# Patient Record
Sex: Female | Born: 1960 | Race: White | Hispanic: No | Marital: Married | Smoking: Former smoker
Health system: Southern US, Community
[De-identification: ages and names within clinical notes are randomized; demographics above are authoritative.]

## PROBLEM LIST (undated history)

## (undated) DIAGNOSIS — Z8719 Personal history of other diseases of the digestive system: Secondary | ICD-10-CM

## (undated) DIAGNOSIS — K219 Gastro-esophageal reflux disease without esophagitis: Secondary | ICD-10-CM

## (undated) DIAGNOSIS — J45909 Unspecified asthma, uncomplicated: Secondary | ICD-10-CM

## (undated) DIAGNOSIS — R4586 Emotional lability: Secondary | ICD-10-CM

## (undated) DIAGNOSIS — F329 Major depressive disorder, single episode, unspecified: Secondary | ICD-10-CM

## (undated) DIAGNOSIS — J189 Pneumonia, unspecified organism: Secondary | ICD-10-CM

## (undated) DIAGNOSIS — F32A Depression, unspecified: Secondary | ICD-10-CM

## (undated) HISTORY — PX: CHOLECYSTECTOMY: SHX55

## (undated) HISTORY — PX: TENDON RELEASE: SHX230

## (undated) HISTORY — PX: HERNIA REPAIR: SHX51

## (undated) HISTORY — PX: TUBAL LIGATION: SHX77

## (undated) HISTORY — DX: Depression, unspecified: F32.A

## (undated) HISTORY — PX: KNEE ARTHROSCOPY: SUR90

## (undated) HISTORY — PX: EXPLORATORY LAPAROTOMY: SUR591

## (undated) HISTORY — DX: Major depressive disorder, single episode, unspecified: F32.9

## (undated) HISTORY — PX: KNEE SURGERY: SHX244

## (undated) HISTORY — PX: VENTRAL HERNIA REPAIR: SHX424

## (undated) HISTORY — PX: HIATAL HERNIA REPAIR: SHX195

## (undated) HISTORY — PX: BARIATRIC SURGERY: SHX1103

---

## 2004-05-20 ENCOUNTER — Ambulatory Visit: Payer: Self-pay | Admitting: Cardiology

## 2008-05-02 ENCOUNTER — Ambulatory Visit: Payer: Self-pay | Admitting: Obstetrics and Gynecology

## 2008-05-09 ENCOUNTER — Ambulatory Visit: Payer: Self-pay | Admitting: Obstetrics and Gynecology

## 2009-10-02 ENCOUNTER — Ambulatory Visit: Payer: Self-pay | Admitting: Obstetrics and Gynecology

## 2011-07-06 HISTORY — PX: ENDOMETRIAL ABLATION: SHX621

## 2012-06-07 ENCOUNTER — Ambulatory Visit: Payer: Self-pay | Admitting: Family Medicine

## 2012-06-07 LAB — URINALYSIS, COMPLETE
Bacteria: NEGATIVE
Glucose,UR: NEGATIVE mg/dL (ref 0–75)
Ketone: NEGATIVE
Nitrite: NEGATIVE
Protein: NEGATIVE
Specific Gravity: 1.02 (ref 1.003–1.030)

## 2012-06-09 LAB — URINE CULTURE

## 2013-09-18 ENCOUNTER — Ambulatory Visit: Payer: Self-pay | Admitting: Family Medicine

## 2013-09-24 ENCOUNTER — Ambulatory Visit: Payer: Self-pay | Admitting: Unknown Physician Specialty

## 2013-10-18 ENCOUNTER — Ambulatory Visit: Payer: Self-pay | Admitting: Unknown Physician Specialty

## 2013-10-22 LAB — PATHOLOGY REPORT

## 2013-11-03 DIAGNOSIS — K469 Unspecified abdominal hernia without obstruction or gangrene: Secondary | ICD-10-CM | POA: Insufficient documentation

## 2015-04-08 ENCOUNTER — Other Ambulatory Visit: Payer: Self-pay | Admitting: Family Medicine

## 2015-05-30 ENCOUNTER — Other Ambulatory Visit: Payer: Self-pay | Admitting: Family Medicine

## 2015-06-04 ENCOUNTER — Other Ambulatory Visit: Payer: Self-pay | Admitting: Family Medicine

## 2015-06-05 ENCOUNTER — Ambulatory Visit
Admission: EM | Admit: 2015-06-05 | Discharge: 2015-06-05 | Disposition: A | Discharge status: Home / Self Care | Payer: BC Managed Care – PPO | Attending: Emergency Medicine | Admitting: Emergency Medicine

## 2015-06-05 DIAGNOSIS — B349 Viral infection, unspecified: Secondary | ICD-10-CM

## 2015-06-05 DIAGNOSIS — J029 Acute pharyngitis, unspecified: Secondary | ICD-10-CM

## 2015-06-05 HISTORY — DX: Unspecified asthma, uncomplicated: J45.909

## 2015-06-05 LAB — RAPID STREP SCREEN (MED CTR MEBANE ONLY): STREPTOCOCCUS, GROUP A SCREEN (DIRECT): NEGATIVE

## 2015-06-05 MED ORDER — ONDANSETRON 4 MG PO TBDP: 4.0000 mg | ORAL_TABLET | Freq: Three times a day (TID) | ORAL | Status: DC | PRN

## 2015-06-05 NOTE — ED Notes (Signed)
Started last Saturday/Sunday with sore throat. Continues with sore throat but today has headache and abdominal discomfort

## 2015-06-05 NOTE — Discharge Instructions (Signed)
Rest. Drink plenty of fluids. Take over the counter tylenol or ibuprofen as needed. Throat lozenges, warm salt water gargles as needed.   Follow up with your primary care physician this week as needed. Return to Urgent care for new or worsening concerns.   Pharyngitis Pharyngitis is redness, pain, and swelling (inflammation) of your pharynx.  CAUSES  Pharyngitis is usually caused by infection. Most of the time, these infections are from viruses (viral) and are part of a cold. However, sometimes pharyngitis is caused by bacteria (bacterial). Pharyngitis can also be caused by allergies. Viral pharyngitis may be spread from person to person by coughing, sneezing, and personal items or utensils (cups, forks, spoons, toothbrushes). Bacterial pharyngitis may be spread from person to person by more intimate contact, such as kissing.  SIGNS AND SYMPTOMS  Symptoms of pharyngitis include:   Sore throat.   Tiredness (fatigue).   Low-grade fever.   Headache.  Joint pain and muscle aches.  Skin rashes.  Swollen lymph nodes.  Plaque-like film on throat or tonsils (often seen with bacterial pharyngitis). DIAGNOSIS  Your health care provider will ask you questions about your illness and your symptoms. Your medical history, along with a physical exam, is often all that is needed to diagnose pharyngitis. Sometimes, a rapid strep test is done. Other lab tests may also be done, depending on the suspected cause.  TREATMENT  Viral pharyngitis will usually get better in 3-4 days without the use of medicine. Bacterial pharyngitis is treated with medicines that kill germs (antibiotics).  HOME CARE INSTRUCTIONS   Drink enough water and fluids to keep your urine clear or pale yellow.   Only take over-the-counter or prescription medicines as directed by your health care provider:   If you are prescribed antibiotics, make sure you finish them even if you start to feel better.   Do not take aspirin.    Get lots of rest.   Gargle with 8 oz of salt water ( tsp of salt per 1 qt of water) as often as every 1-2 hours to soothe your throat.   Throat lozenges (if you are not at risk for choking) or sprays may be used to soothe your throat. SEEK MEDICAL CARE IF:   You have large, tender lumps in your neck.  You have a rash.  You cough up green, yellow-brown, or bloody spit. SEEK IMMEDIATE MEDICAL CARE IF:   Your neck becomes stiff.  You drool or are unable to swallow liquids.  You vomit or are unable to keep medicines or liquids down.  You have severe pain that does not go away with the use of recommended medicines.  You have trouble breathing (not caused by a stuffy nose). MAKE SURE YOU:   Understand these instructions.  Will watch your condition.  Will get help right away if you are not doing well or get worse.   This information is not intended to replace advice given to you by your health care provider. Make sure you discuss any questions you have with your health care provider.   Document Released: 06/21/2005 Document Revised: 04/11/2013 Document Reviewed: 02/26/2013 Elsevier Interactive Patient Education 2016 [employer]  Viral Infections A viral infection can be caused by different types of viruses.Most viral infections are not serious and resolve on their own. However, some infections may cause severe symptoms and may lead to further complications. SYMPTOMS Viruses can frequently cause:  Minor sore throat.  Aches and pains.  Headaches.  Runny nose.  Different types of  rashes.  Watery eyes.  Tiredness.  Cough.  Loss of appetite.  Gastrointestinal infections, resulting in nausea, vomiting, and diarrhea. These symptoms do not respond to antibiotics because the infection is not caused by bacteria. However, you might catch a bacterial infection following the viral infection. This is sometimes called a "superinfection." Symptoms of such a bacterial  infection may include:  Worsening sore throat with pus and difficulty swallowing.  Swollen neck glands.  Chills and a high or persistent fever.  Severe headache.  Tenderness over the sinuses.  Persistent overall ill feeling (malaise), muscle aches, and tiredness (fatigue).  Persistent cough.  Yellow, green, or brown mucus production with coughing. HOME CARE INSTRUCTIONS   Only take over-the-counter or prescription medicines for pain, discomfort, diarrhea, or fever as directed by your caregiver.  Drink enough water and fluids to keep your urine clear or pale yellow. Sports drinks can provide valuable electrolytes, sugars, and hydration.  Get plenty of rest and maintain proper nutrition. Soups and broths with crackers or rice are fine. SEEK IMMEDIATE MEDICAL CARE IF:   You have severe headaches, shortness of breath, chest pain, neck pain, or an unusual rash.  You have uncontrolled vomiting, diarrhea, or you are unable to keep down fluids.  You or your child has an oral temperature above 102 F (38.9 C), not controlled by medicine.  Your baby is older than 3 months with a rectal temperature of 102 F (38.9 C) or higher.  Your baby is 62 months old or younger with a rectal temperature of 100.4 F (38 C) or higher. MAKE SURE YOU:   Understand these instructions.  Will watch your condition.  Will get help right away if you are not doing well or get worse.   This information is not intended to replace advice given to you by your health care provider. Make sure you discuss any questions you have with your health care provider.   Document Released: 03/31/2005 Document Revised: 09/13/2011 Document Reviewed: 11/27/2014 Elsevier Interactive Patient Education [employer]

## 2015-06-05 NOTE — ED Provider Notes (Signed)
[facility]  ____________________________________________  Time seen: Approximately 1145 am  I have reviewed the triage vital signs and the nursing notes.   HISTORY  Chief Complaint Sore Throat   HPI Annette Perez is a 54 y.o. female presents for the complaints of 2-3 days of sore throat and also reports in the last day or so some intermittent abdominal discomfort which is described as a queasiness occasional nausea. Reports does continue to eat and drink well. Denies vomiting or diarrhea. Denies abdominal pain. She reports sore throat at this time is described as a mild scratchiness at 4 out of 10. Denies pain radiation. States occasionally she will have a headache and she described this as a "stress headache" that is similar to her normal headaches. Denies current headache. States headache as well as sore throat is improved with over-the-counter ibuprofen.  Reports that she does work in [employer] school and frequently exposed to sick children. Denies sick contacts at home. Denies chest pain, shortness breath, abdominal pain, fever, back pain or neck pain. Denies other complaints. Patient states that she is here she primarily wanted to make sure she did not have strep throat.   Past Medical History  Diagnosis Date  . Asthma     There are no active problems to display for this patient.   Past Surgical History  Procedure Laterality Date  . Hiatal hernia repair      1990's  . Cholecystectomy    . Knee arthroscopy    . Bariatric surgery      2011  . Hernia repair      Current Outpatient Rx  Name  Route  Sig  Dispense  Refill  . ADVAIR DISKUS 250-50 MCG/DOSE AEPB      TAKE 1 PUFF TWICE A DAY   60 each   0   . albuterol (PROVENTIL HFA;VENTOLIN HFA) 108 (90 BASE) MCG/ACT inhaler   Inhalation   Inhale into the lungs every 6 (six) hours as needed for wheezing or shortness of breath.         . Multiple Vitamin (MULTIVITAMIN) tablet   Oral   Take 1 tablet by mouth  daily.         . sertraline (ZOLOFT) 100 MG tablet      TAKE 1 TABLET BY MOUTH EVERY DAY   30 tablet   0   .             Allergies Review of patient's allergies indicates no known allergies.  Family History  Problem Relation Age of Onset  . Adopted: Yes    Social History Social History  Substance Use Topics  . Smoking status: Former Games developer  . Smokeless tobacco: None  . Alcohol Use: Yes     Comment: socially    Review of Systems Constitutional: No fever/chills Eyes: No visual changes. WGN:FAOZHYQM sore throat. No nasal congestion, sinus pressure.  Cardiovascular: Denies chest pain. Respiratory: Denies shortness of breath. Gastrointestinal: No abdominal pain.  No nausea, no vomiting.  No diarrhea.  No constipation. Genitourinary: Negative for dysuria. Musculoskeletal: Negative for back pain. Skin: Negative for rash. Neurological: Negative for headaches, focal weakness or numbness.  10-point ROS otherwise negative.  ____________________________________________   PHYSICAL EXAM:  VITAL SIGNS: ED Triage Vitals  Enc Vitals Group     BP 06/05/15 1110 146/82 mmHg     Pulse Rate 06/05/15 1110 54     Resp 06/05/15 1110 16     Temp 06/05/15 1110 96.5 F (35.8 C)  Temp Source 06/05/15 1110 Tympanic     SpO2 06/05/15 1110 98 %     Weight 06/05/15 1110 260 lb (117.935 kg)     Height 06/05/15 1110 5\' 9"  (1.753 m)     Head Cir --      Peak Flow --      Pain Score 06/05/15 1115 6     Pain Loc --      Pain Edu? --      Excl. in GC? --     Constitutional: Alert and oriented. Well appearing and in no acute distress. Eyes: Conjunctivae are normal. PERRL. EOMI. Head: Atraumatic. No sinus tenderness to palpation. No swelling. No erythema.  Ears: no erythema, normal TMs bilaterally.   Nose: No congestion/rhinnorhea.  Mouth/Throat: Mucous membranes are moist.  Minimal pharyngeal erythema. No tonsillar swelling or exudate. No uvular shift or deviation. Neck: No  stridor.  No cervical spine tenderness to palpation. Hematological/Lymphatic/Immunilogical: No cervical lymphadenopathy. Cardiovascular: Normal rate, regular rhythm. Grossly normal heart sounds.  Good peripheral circulation. Respiratory: Normal respiratory effort.  No retractions. Lungs CTAB. No wheezes, rales or rhonchi. Good air movement. Gastrointestinal: Soft and nontender. Obese abdomen. Normal Bowel sounds.  No abdominal bruits. No CVA tenderness. Musculoskeletal: No lower or upper extremity tenderness nor edema.   Bilateral pedal pulses equal and easily palpated.  Neurologic:  Normal speech and language. No gross focal neurologic deficits are appreciated. No gait instability. Skin:  Skin is warm, dry and intact. No rash noted. Psychiatric: Mood and affect are normal. Speech and behavior are normal.  ____________________________________________   LABS (all labs ordered are listed, but only abnormal results are displayed)  Labs Reviewed  RAPID STREP SCREEN (NOT AT [facility])  CULTURE, GROUP A STREP ([facility] ONLY)     INITIAL IMPRESSION / ASSESSMENT AND PLAN / ED COURSE  Pertinent labs & imaging results that were available during my care of the patient were reviewed by me and considered in my medical decision making (see chart for details).  Very well-appearing patient. No acute distress. Presents for complaints of to 3 days of sore throat as well as reported queasiness with nausea. Denies vomiting or diarrhea. Reports continues to eat and drink well. Afebrile. Lungs clear throughout. Abdomen soft and nontender. Quick strep Negative, Will Culture. Discussed with Patient Suspect Viral Illness and Viral pharyngitis.  Discussed Will Treat Supportively will treat with when necessary Zofran and encouraged food and fluids, when necessary over-the-counter Tylenol or ibuprofen. Work note given for today and tomorrow.  Discussed strict follow-up and return parameters. Work note for today and tomorrow  given.scussed follow up with Primary care physician this week. Discussed follow up and return parameters including no resolution or any worsening concerns. Patient verbalized understanding and agreed to plan.   ____________________________________________   FINAL CLINICAL IMPRESSION(S) / ED DIAGNOSES  Final diagnoses:  Pharyngitis  Viral illness       Renford DillsLindsey Haeli Gerlich, NP 06/05/15 1524

## 2015-06-07 LAB — CULTURE, GROUP A STREP (THRC)

## 2015-07-04 ENCOUNTER — Other Ambulatory Visit: Payer: Self-pay | Admitting: Family Medicine

## 2015-08-05 ENCOUNTER — Other Ambulatory Visit: Payer: Self-pay | Admitting: Family Medicine

## 2015-09-07 ENCOUNTER — Other Ambulatory Visit: Payer: Self-pay | Admitting: Family Medicine

## 2015-09-22 ENCOUNTER — Other Ambulatory Visit: Payer: Self-pay | Admitting: Family Medicine

## 2015-10-06 ENCOUNTER — Other Ambulatory Visit: Payer: Self-pay | Admitting: Family Medicine

## 2015-10-07 ENCOUNTER — Ambulatory Visit (INDEPENDENT_AMBULATORY_CARE_PROVIDER_SITE_OTHER): Payer: BC Managed Care – PPO | Admitting: Family Medicine

## 2015-10-07 ENCOUNTER — Encounter: Payer: Self-pay | Admitting: Family Medicine

## 2015-10-07 VITALS — BP 120/80 | HR 72 | Ht 69.0 in | Wt 273.0 lb

## 2015-10-07 DIAGNOSIS — J0100 Acute maxillary sinusitis, unspecified: Secondary | ICD-10-CM

## 2015-10-07 DIAGNOSIS — F329 Major depressive disorder, single episode, unspecified: Secondary | ICD-10-CM | POA: Diagnosis not present

## 2015-10-07 DIAGNOSIS — S0300XA Dislocation of jaw, unspecified side, initial encounter: Secondary | ICD-10-CM

## 2015-10-07 DIAGNOSIS — D692 Other nonthrombocytopenic purpura: Secondary | ICD-10-CM | POA: Diagnosis not present

## 2015-10-07 DIAGNOSIS — F32A Depression, unspecified: Secondary | ICD-10-CM

## 2015-10-07 DIAGNOSIS — J4520 Mild intermittent asthma, uncomplicated: Secondary | ICD-10-CM

## 2015-10-07 MED ORDER — AMOXICILLIN 500 MG PO CAPS: 500.0000 mg | ORAL_CAPSULE | Freq: Three times a day (TID) | ORAL | Status: DC

## 2015-10-07 MED ORDER — ALBUTEROL SULFATE HFA 108 (90 BASE) MCG/ACT IN AERS: 1.0000 | INHALATION_SPRAY | Freq: Four times a day (QID) | RESPIRATORY_TRACT | Status: DC | PRN

## 2015-10-07 MED ORDER — SERTRALINE HCL 100 MG PO TABS: 100.0000 mg | ORAL_TABLET | Freq: Every day | ORAL | Status: DC

## 2015-10-07 MED ORDER — FLUTICASONE-SALMETEROL 250-50 MCG/DOSE IN AEPB: INHALATION_SPRAY | RESPIRATORY_TRACT | Status: DC

## 2015-10-07 NOTE — Progress Notes (Signed)
Name: Annette Perez   MRN: [id number]    DOB: 12/19/1960   Date:10/07/2015       Progress Note  Subjective  Chief Complaint  Chief Complaint  Patient presents with  . Asthma  . Depression  . Ear Pain    L) ear pain- hurts to lay on that side    Asthma She complains of wheezing. There is no chest tightness, cough, difficulty breathing, frequent throat clearing, hemoptysis, hoarse voice, shortness of breath or sputum production. The current episode started more than 1 year ago. The problem occurs intermittently. The problem has been waxing and waning. Associated symptoms include ear pain, nasal congestion, postnasal drip, rhinorrhea and a sore throat. Pertinent negatives include no appetite change, chest pain, dyspnea on exertion, ear congestion, fever, headaches, heartburn, malaise/fatigue, myalgias or weight loss. Her symptoms are aggravated by nothing. Her symptoms are alleviated by beta-agonist and steroid inhaler. Her past medical history is significant for asthma.  Depression        This is a chronic problem.  The current episode started more than 1 year ago.   The onset quality is gradual.   The problem occurs daily.  The problem has been waxing and waning since onset.  Associated symptoms include no decreased concentration, no fatigue, no helplessness, no hopelessness, does not have insomnia, not irritable, no restlessness, no decreased interest, no appetite change, no body aches, no myalgias, no headaches, no indigestion, not sad and no suicidal ideas.  Past treatments include SSRIs - Selective serotonin reuptake inhibitors.  Compliance with treatment is good.   No problem-specific assessment & plan notes found for this encounter.   Past Medical History  Diagnosis Date  . Asthma   . Depression     Past Surgical History  Procedure Laterality Date  . Hiatal hernia repair      1990's  . Cholecystectomy    . Knee arthroscopy    . Bariatric surgery      2011  . Hernia repair       Family History  Problem Relation Age of Onset  . Adopted: Yes    Social History   Social History  . Marital Status: Single    Spouse Name: N/A  . Number of Children: N/A  . Years of Education: N/A   Occupational History  . Not on file.   Social History Main Topics  . Smoking status: Former Games developermoker  . Smokeless tobacco: Not on file  . Alcohol Use: Yes     Comment: socially  . Drug Use: Not on file  . Sexual Activity: Not on file   Other Topics Concern  . Not on file   Social History Narrative    No Known Allergies   Review of Systems  Constitutional: Negative for fever, chills, weight loss, malaise/fatigue, appetite change and fatigue.  HENT: Positive for ear pain, postnasal drip, rhinorrhea and sore throat. Negative for ear discharge and hoarse voice.   Eyes: Negative for blurred vision.  Respiratory: Positive for wheezing. Negative for cough, hemoptysis, sputum production and shortness of breath.   Cardiovascular: Negative for chest pain, dyspnea on exertion, palpitations and leg swelling.  Gastrointestinal: Negative for heartburn, nausea, abdominal pain, diarrhea, constipation, blood in stool and melena.  Genitourinary: Negative for dysuria, urgency, frequency and hematuria.  Musculoskeletal: Negative for myalgias, back pain, joint pain and neck pain.  Skin: Negative for rash.  Neurological: Negative for dizziness, tingling, sensory change, focal weakness and headaches.  Endo/Heme/Allergies: Negative for environmental allergies and  polydipsia. Does not bruise/bleed easily.  Psychiatric/Behavioral: Positive for depression. Negative for suicidal ideas and decreased concentration. The patient is not nervous/anxious and does not have insomnia.      Objective  Filed Vitals:   10/07/15 1337  BP: 120/80  Pulse: 72  Height:  (1.753 m)  Weight: 273 lb (123.832 kg)    Physical Exam  Constitutional: She is well-developed, well-nourished, and in no  distress. She is not irritable. No distress.  HENT:  Head: Normocephalic and atraumatic.  Right Ear: External ear normal.  Left Ear: External ear normal.  Nose: Nose normal.  Mouth/Throat: Oropharynx is clear and moist.  Eyes: Conjunctivae and EOM are normal. Pupils are equal, round, and reactive to light. Right eye exhibits no discharge. Left eye exhibits no discharge.  Neck: Normal range of motion. Neck supple. No JVD present. No thyromegaly present.  Cardiovascular: Normal rate, regular rhythm, normal heart sounds and intact distal pulses.  Exam reveals no gallop and no friction rub.   No murmur heard. Pulmonary/Chest: Effort normal. No respiratory distress. She has no wheezes. She has no rales.  Abdominal: Soft. Bowel sounds are normal. She exhibits no mass. There is no tenderness. There is no guarding.  Musculoskeletal: Normal range of motion. She exhibits tenderness. She exhibits no edema.  Left tmj  Lymphadenopathy:    She has no cervical adenopathy.  Neurological: She is alert. She has normal reflexes.  Skin: Skin is warm and dry. She is not diaphoretic.  Psychiatric: Mood and affect normal.  Nursing note and vitals reviewed.     Assessment & Plan  Problem List Items Addressed This Visit    None    Visit Diagnoses    Asthma, mild intermittent, uncomplicated    -  Primary    Relevant Medications    Fluticasone-Salmeterol (ADVAIR DISKUS) 250-50 MCG/DOSE AEPB    albuterol (PROVENTIL HFA;VENTOLIN HFA) 108 (90 Base) MCG/ACT inhaler    Depression        Relevant Medications    sertraline (ZOLOFT) 100 MG tablet    TMJ (dislocation of temporomandibular joint), initial encounter        suggested aleve    Acute maxillary sinusitis, recurrence not specified        Relevant Medications    amoxicillin (AMOXIL) 500 MG capsule    Purpura (HCC)        Relevant Orders    CBC w/Diff/Platelet         Dr. Hayden [facility]  10/07/2015

## 2015-10-08 LAB — CBC WITH DIFFERENTIAL/PLATELET
BASOS: 0 %
Basophils Absolute: 0 10*3/uL (ref 0.0–0.2)
EOS (ABSOLUTE): 0.1 10*3/uL (ref 0.0–0.4)
EOS: 2 %
HEMATOCRIT: 40.9 % (ref 34.0–46.6)
HEMOGLOBIN: 14.1 g/dL (ref 11.1–15.9)
IMMATURE GRANS (ABS): 0 10*3/uL (ref 0.0–0.1)
IMMATURE GRANULOCYTES: 0 %
Lymphocytes Absolute: 2.1 10*3/uL (ref 0.7–3.1)
Lymphs: 27 %
MCH: 30.1 pg (ref 26.6–33.0)
MCHC: 34.5 g/dL (ref 31.5–35.7)
MCV: 87 fL (ref 79–97)
MONOCYTES: 7 %
MONOS ABS: 0.5 10*3/uL (ref 0.1–0.9)
NEUTROS PCT: 64 %
Neutrophils Absolute: 4.8 10*3/uL (ref 1.4–7.0)
Platelets: 246 10*3/uL (ref 150–379)
RBC: 4.68 x10E6/uL (ref 3.77–5.28)
RDW: 13.1 % (ref 12.3–15.4)
WBC: 7.5 10*3/uL (ref 3.4–10.8)

## 2015-11-05 ENCOUNTER — Other Ambulatory Visit: Payer: Self-pay | Admitting: Family Medicine

## 2015-11-25 ENCOUNTER — Ambulatory Visit (INDEPENDENT_AMBULATORY_CARE_PROVIDER_SITE_OTHER): Payer: BC Managed Care – PPO | Admitting: Family Medicine

## 2015-11-25 ENCOUNTER — Encounter: Payer: Self-pay | Admitting: Family Medicine

## 2015-11-25 VITALS — BP 130/80 | HR 80 | Ht 69.0 in | Wt 269.0 lb

## 2015-11-25 DIAGNOSIS — J0100 Acute maxillary sinusitis, unspecified: Secondary | ICD-10-CM | POA: Diagnosis not present

## 2015-11-25 MED ORDER — AZITHROMYCIN 250 MG PO TABS: ORAL_TABLET | ORAL | Status: DC

## 2015-11-25 NOTE — Progress Notes (Signed)
Name: Annette Perez   MRN: [id number]    DOB: 09/19/1960   Date:11/25/2015       Progress Note  Subjective  Chief Complaint  Chief Complaint  Patient presents with  . Sinusitis    x 1 week- tried Mucinex D- not helping. Has green production and sore throat    Sinusitis This is a new problem. The current episode started in the past 7 days. The problem has been gradually worsening since onset. There has been no fever. Her pain is at a severity of 3/10. Associated symptoms include congestion, coughing, headaches, a hoarse voice, sinus pressure and a sore throat. Pertinent negatives include no chills, diaphoresis, ear pain, neck pain, shortness of breath, sneezing or swollen glands. Past treatments include acetaminophen. The treatment provided no relief.    No problem-specific assessment & plan notes found for this encounter.   Past Medical History  Diagnosis Date  . Asthma   . Depression     Past Surgical History  Procedure Laterality Date  . Hiatal hernia repair      1990's  . Cholecystectomy    . Knee arthroscopy    . Bariatric surgery      2011  . Hernia repair      Family History  Problem Relation Age of Onset  . Adopted: Yes    Social History   Social History  . Marital Status: Single    Spouse Name: N/A  . Number of Children: N/A  . Years of Education: N/A   Occupational History  . Not on file.   Social History Main Topics  . Smoking status: Former Games developermoker  . Smokeless tobacco: Not on file  . Alcohol Use: Yes     Comment: socially  . Drug Use: Not on file  . Sexual Activity: Not on file   Other Topics Concern  . Not on file   Social History Narrative    No Known Allergies   Review of Systems  Constitutional: Negative for fever, chills, weight loss, malaise/fatigue and diaphoresis.  HENT: Positive for congestion, hoarse voice, sinus pressure and sore throat. Negative for ear discharge, ear pain and sneezing.   Eyes: Negative for blurred vision.   Respiratory: Positive for cough. Negative for sputum production, shortness of breath and wheezing.   Cardiovascular: Negative for chest pain, palpitations and leg swelling.  Gastrointestinal: Negative for heartburn, nausea, abdominal pain, diarrhea, constipation, blood in stool and melena.  Genitourinary: Negative for dysuria, urgency, frequency and hematuria.  Musculoskeletal: Negative for myalgias, back pain, joint pain and neck pain.  Skin: Negative for rash.  Neurological: Positive for headaches. Negative for dizziness, tingling, sensory change and focal weakness.  Endo/Heme/Allergies: Negative for environmental allergies and polydipsia. Does not bruise/bleed easily.  Psychiatric/Behavioral: Negative for depression and suicidal ideas. The patient is not nervous/anxious and does not have insomnia.      Objective  Filed Vitals:   11/25/15 1006  BP: 130/80  Pulse: 80  Height: 5\' 9"  (1.753 m)  Weight: 269 lb (122.018 kg)    Physical Exam  Constitutional: She is well-developed, well-nourished, and in no distress. No distress.  HENT:  Head: Normocephalic and atraumatic.  Right Ear: External ear normal.  Left Ear: External ear normal.  Nose: Nose normal.  Mouth/Throat: Oropharynx is clear and moist.  Eyes: Conjunctivae and EOM are normal. Pupils are equal, round, and reactive to light. Right eye exhibits no discharge. Left eye exhibits no discharge.  Neck: Normal range of motion. Neck supple. No  JVD present. No thyromegaly present.  Cardiovascular: Normal rate, regular rhythm, normal heart sounds and intact distal pulses.  Exam reveals no gallop and no friction rub.   No murmur heard. Pulmonary/Chest: Effort normal and breath sounds normal.  Abdominal: Soft. Bowel sounds are normal. She exhibits no mass. There is no tenderness. There is no guarding.  Musculoskeletal: Normal range of motion. She exhibits no edema.  Lymphadenopathy:    She has no cervical adenopathy.   Neurological: She is alert. She has normal reflexes.  Skin: Skin is warm and dry. She is not diaphoretic.  Psychiatric: Mood and affect normal.  Nursing note and vitals reviewed.     Assessment & Plan  Problem List Items Addressed This Visit    None    Visit Diagnoses    Acute maxillary sinusitis, recurrence not specified    -  Primary    Relevant Medications    azithromycin (ZITHROMAX) 250 MG tablet         Dr. [facility]  11/25/2015

## 2015-12-06 ENCOUNTER — Other Ambulatory Visit: Payer: Self-pay | Admitting: Family Medicine

## 2016-02-16 ENCOUNTER — Other Ambulatory Visit: Payer: Self-pay | Admitting: Family Medicine

## 2016-02-17 ENCOUNTER — Other Ambulatory Visit: Payer: Self-pay | Admitting: Family Medicine

## 2016-04-19 ENCOUNTER — Other Ambulatory Visit: Payer: Self-pay | Admitting: Family Medicine

## 2016-04-19 DIAGNOSIS — J4520 Mild intermittent asthma, uncomplicated: Secondary | ICD-10-CM

## 2016-04-22 ENCOUNTER — Ambulatory Visit (INDEPENDENT_AMBULATORY_CARE_PROVIDER_SITE_OTHER): Payer: BC Managed Care – PPO | Admitting: Family Medicine

## 2016-04-22 ENCOUNTER — Encounter: Payer: Self-pay | Admitting: Family Medicine

## 2016-04-22 VITALS — BP 122/64 | HR 59 | Ht 69.0 in | Wt 272.0 lb

## 2016-04-22 DIAGNOSIS — J40 Bronchitis, not specified as acute or chronic: Secondary | ICD-10-CM

## 2016-04-22 DIAGNOSIS — J4521 Mild intermittent asthma with (acute) exacerbation: Secondary | ICD-10-CM

## 2016-04-22 DIAGNOSIS — F5102 Adjustment insomnia: Secondary | ICD-10-CM

## 2016-04-22 MED ORDER — ZOLPIDEM TARTRATE 10 MG PO TABS: 10.0000 mg | ORAL_TABLET | Freq: Every evening | ORAL | 1 refills | Status: DC | PRN

## 2016-04-22 MED ORDER — AMOXICILLIN-POT CLAVULANATE 875-125 MG PO TABS: 1.0000 | ORAL_TABLET | Freq: Two times a day (BID) | ORAL | 0 refills | Status: DC

## 2016-04-22 MED ORDER — GUAIFENESIN-CODEINE 100-10 MG/5ML PO SYRP: 5.0000 mL | ORAL_SOLUTION | Freq: Three times a day (TID) | ORAL | 0 refills | Status: DC | PRN

## 2016-04-22 MED ORDER — ALBUTEROL SULFATE (2.5 MG/3ML) 0.083% IN NEBU: 2.5000 mg | INHALATION_SOLUTION | Freq: Four times a day (QID) | RESPIRATORY_TRACT | 1 refills | Status: DC | PRN

## 2016-04-22 NOTE — Progress Notes (Addendum)
Name: Annette Perez   MRN: [id number]    DOB: 03/12/1961   Date:08/11/2016       Progress Note  Subjective  Chief Complaint  Chief Complaint  Patient presents with  . Cough    dry couch- no production. Doesn't cough much, so she says "used to having bronchitis and this is acting different." Chest is sore, and radiates through back ribs.     Cough  This is a new problem. The current episode started yesterday. The problem has been waxing and waning. The problem occurs every few minutes. The cough is non-productive. Associated symptoms include chills, postnasal drip and shortness of breath. Pertinent negatives include no chest pain, ear congestion, ear pain, fever, headaches, heartburn, hemoptysis, myalgias, nasal congestion, rash, rhinorrhea, sore throat, sweats, weight loss or wheezing. The symptoms are aggravated by cold air. She has tried steroid inhaler and a beta-agonist inhaler for the symptoms. The treatment provided no relief. Her past medical history is significant for asthma and bronchitis. There is no history of bronchiectasis, COPD, emphysema, environmental allergies or pneumonia.  Insomnia  Primary symptoms: fragmented sleep, difficulty falling asleep, no malaise/fatigue.  The current episode started one month. The problem occurs nightly. The symptoms are aggravated by anxiety and family issues. PMH includes: no depression.    No problem-specific Assessment & Plan notes found for this encounter.   Past Medical History:  Diagnosis Date  . Asthma   . Depression     Past Surgical History:  Procedure Laterality Date  . BARIATRIC SURGERY     2011  . CHOLECYSTECTOMY    . HERNIA REPAIR    . HIATAL HERNIA REPAIR     1990's  . KNEE ARTHROSCOPY      Family History  Problem Relation Age of Onset  . Adopted: Yes    Social History   Social History  . Marital status: Single    Spouse name: N/A  . Number of children: N/A  . Years of education: N/A   Occupational History   . Not on file.   Social History Main Topics  . Smoking status: Former Games developermoker  . Smokeless tobacco: Never Used  . Alcohol use Yes     Comment: socially  . Drug use: No  . Sexual activity: Not on file   Other Topics Concern  . Not on file   Social History Narrative  . No narrative on file    No Known Allergies   Review of Systems  Constitutional: Positive for chills. Negative for fever, malaise/fatigue and weight loss.  HENT: Positive for postnasal drip. Negative for ear discharge, ear pain, rhinorrhea and sore throat.   Eyes: Negative for blurred vision.  Respiratory: Positive for cough and shortness of breath. Negative for hemoptysis, sputum production and wheezing.   Cardiovascular: Negative for chest pain, palpitations and leg swelling.  Gastrointestinal: Negative for abdominal pain, blood in stool, constipation, diarrhea, heartburn, melena and nausea.  Genitourinary: Negative for dysuria, frequency, hematuria and urgency.  Musculoskeletal: Negative for back pain, joint pain, myalgias and neck pain.  Skin: Negative for rash.  Neurological: Negative for dizziness, tingling, sensory change, focal weakness and headaches.  Endo/Heme/Allergies: Negative for environmental allergies and polydipsia. Does not bruise/bleed easily.  Psychiatric/Behavioral: Negative for depression and suicidal ideas. The patient is not nervous/anxious and does not have insomnia.      Objective  Vitals:   04/22/16 1456  BP: 122/64  Pulse: (!) 59  Weight: 272 lb (123.4 kg)  Height: 5\' 9"  (1.753  m)    Physical Exam  Constitutional: She is well-developed, well-nourished, and in no distress. No distress.  HENT:  Head: Normocephalic and atraumatic.  Right Ear: External ear normal.  Left Ear: External ear normal.  Nose: Nose normal.  Mouth/Throat: Oropharynx is clear and moist.  Eyes: Conjunctivae and EOM are normal. Pupils are equal, round, and reactive to light. Right eye exhibits no  discharge. Left eye exhibits no discharge.  Neck: Normal range of motion. Neck supple. No JVD present. No thyromegaly present.  Cardiovascular: Normal rate, regular rhythm, normal heart sounds and intact distal pulses.  Exam reveals no gallop and no friction rub.   No murmur heard. Pulmonary/Chest: Effort normal and breath sounds normal. No accessory muscle usage. No respiratory distress.  Increased E/I  Abdominal: Soft. Bowel sounds are normal. She exhibits no mass. There is no tenderness. There is no guarding.  Musculoskeletal: Normal range of motion. She exhibits no edema or tenderness.  Lymphadenopathy:    She has no cervical adenopathy.  Neurological: She is alert. She has normal reflexes.  Skin: Skin is warm and dry. She is not diaphoretic.  Psychiatric: Mood and affect normal.  Nursing note and vitals reviewed.     Assessment & Plan  Problem List Items Addressed This Visit    None    Visit Diagnoses    Mild intermittent reactive airway disease with acute exacerbation    -  Primary   cont bronchodilators   Relevant Medications   albuterol (PROVENTIL) (2.5 MG/3ML) 0.083% nebulizer solution   Adjustment insomnia       Bronchitis            Dr. Hayden [facility]  08/11/16

## 2016-04-23 ENCOUNTER — Other Ambulatory Visit: Payer: Self-pay

## 2016-04-23 ENCOUNTER — Ambulatory Visit: Payer: BC Managed Care – PPO | Admitting: Family Medicine

## 2016-06-14 ENCOUNTER — Encounter: Payer: Self-pay | Admitting: Family Medicine

## 2016-06-14 ENCOUNTER — Ambulatory Visit (INDEPENDENT_AMBULATORY_CARE_PROVIDER_SITE_OTHER): Payer: BC Managed Care – PPO | Admitting: Family Medicine

## 2016-06-14 VITALS — BP 120/70 | HR 70 | Ht 66.0 in | Wt 276.0 lb

## 2016-06-14 DIAGNOSIS — J0100 Acute maxillary sinusitis, unspecified: Secondary | ICD-10-CM

## 2016-06-14 MED ORDER — FLUCONAZOLE 150 MG PO TABS: 150.0000 mg | ORAL_TABLET | Freq: Once | ORAL | 0 refills | Status: AC

## 2016-06-14 MED ORDER — AMOXICILLIN-POT CLAVULANATE 875-125 MG PO TABS: 1.0000 | ORAL_TABLET | Freq: Two times a day (BID) | ORAL | 0 refills | Status: DC

## 2016-06-14 NOTE — Progress Notes (Signed)
Name: Annette Perez   MRN: [id number]    DOB: 07/17/1960   Date:06/14/2016       Progress Note  Subjective  Chief Complaint  Chief Complaint  Patient presents with  . Sinusitis    cong and drainage- facial pressure/ green production- had round of ZPack and Augmentin in October    Sinusitis  This is a new problem. The current episode started 1 to 4 weeks ago. The problem has been waxing and waning since onset. There has been no fever. Her pain is at a severity of 4/10. The pain is moderate. Associated symptoms include congestion, headaches, a hoarse voice, sinus pressure, sneezing and a sore throat. Pertinent negatives include no chills, coughing, diaphoresis, ear pain, neck pain, shortness of breath or swollen glands. Past treatments include nothing. The treatment provided mild relief.    No problem-specific Assessment & Plan notes found for this encounter.   Past Medical History:  Diagnosis Date  . Asthma   . Depression     Past Surgical History:  Procedure Laterality Date  . BARIATRIC SURGERY     2011  . CHOLECYSTECTOMY    . HERNIA REPAIR    . HIATAL HERNIA REPAIR     1990's  . KNEE ARTHROSCOPY      Family History  Problem Relation Age of Onset  . Adopted: Yes    Social History   Social History  . Marital status: Single    Spouse name: N/A  . Number of children: N/A  . Years of education: N/A   Occupational History  . Not on file.   Social History Main Topics  . Smoking status: Former Games developermoker  . Smokeless tobacco: Never Used  . Alcohol use Yes     Comment: socially  . Drug use: No  . Sexual activity: Not on file   Other Topics Concern  . Not on file   Social History Narrative  . No narrative on file    No Known Allergies   Review of Systems  Constitutional: Negative for chills, diaphoresis, fever, malaise/fatigue and weight loss.  HENT: Positive for congestion, hoarse voice, sinus pressure, sneezing and sore throat. Negative for ear discharge  and ear pain.   Eyes: Negative for blurred vision.  Respiratory: Negative for cough, sputum production, shortness of breath and wheezing.   Cardiovascular: Negative for chest pain, palpitations and leg swelling.  Gastrointestinal: Negative for abdominal pain, blood in stool, constipation, diarrhea, heartburn, melena and nausea.  Genitourinary: Negative for dysuria, frequency, hematuria and urgency.  Musculoskeletal: Negative for back pain, joint pain, myalgias and neck pain.  Skin: Negative for rash.  Neurological: Positive for headaches. Negative for dizziness, tingling, sensory change and focal weakness.  Endo/Heme/Allergies: Negative for environmental allergies and polydipsia. Does not bruise/bleed easily.  Psychiatric/Behavioral: Negative for depression and suicidal ideas. The patient is not nervous/anxious and does not have insomnia.      Objective  Vitals:   06/14/16 1029  BP: 120/70  Pulse: 70  Weight: 276 lb (125.2 kg)  Height: 5\' 6"  (1.676 m)    Physical Exam  Constitutional: She is well-developed, well-nourished, and in no distress. No distress.  HENT:  Head: Normocephalic and atraumatic.  Right Ear: External ear normal.  Left Ear: External ear normal.  Nose: Nose normal.  Mouth/Throat: Oropharynx is clear and moist.  Eyes: Conjunctivae and EOM are normal. Pupils are equal, round, and reactive to light. Right eye exhibits no discharge. Left eye exhibits no discharge.  Neck: Normal range  of motion. Neck supple. No JVD present. No thyromegaly present.  Cardiovascular: Normal rate, regular rhythm, normal heart sounds and intact distal pulses.  Exam reveals no gallop and no friction rub.   No murmur heard. Pulmonary/Chest: Effort normal and breath sounds normal.  Abdominal: Soft. Bowel sounds are normal. She exhibits no mass. There is no tenderness. There is no guarding.  Musculoskeletal: Normal range of motion. She exhibits no edema.  Lymphadenopathy:    She has no  cervical adenopathy.  Neurological: She is alert. She has normal reflexes.  Skin: Skin is warm and dry. She is not diaphoretic.  Psychiatric: Mood and affect normal.  Nursing note and vitals reviewed.     Assessment & Plan  Problem List Items Addressed This Visit    None    Visit Diagnoses    Acute maxillary sinusitis, recurrence not specified    -  Primary   Relevant Medications   amoxicillin-clavulanate (AUGMENTIN) 875-125 MG tablet   fluconazole (DIFLUCAN) 150 MG tablet        Dr. Hayden Rasmusseneanna Aras [facility]bar[facility]  06/14/16

## 2016-06-23 ENCOUNTER — Other Ambulatory Visit: Payer: Self-pay | Admitting: Family Medicine

## 2016-07-29 ENCOUNTER — Other Ambulatory Visit: Payer: Self-pay | Admitting: Family Medicine

## 2016-07-29 DIAGNOSIS — J4520 Mild intermittent asthma, uncomplicated: Secondary | ICD-10-CM

## 2016-08-11 ENCOUNTER — Other Ambulatory Visit: Payer: Self-pay

## 2016-08-18 ENCOUNTER — Encounter: Payer: Self-pay | Admitting: Family Medicine

## 2016-08-18 ENCOUNTER — Ambulatory Visit (INDEPENDENT_AMBULATORY_CARE_PROVIDER_SITE_OTHER): Payer: BC Managed Care – PPO | Admitting: Family Medicine

## 2016-08-18 VITALS — BP 110/70 | HR 76 | Temp 98.0°F | Ht 66.0 in | Wt 274.0 lb

## 2016-08-18 DIAGNOSIS — J111 Influenza due to unidentified influenza virus with other respiratory manifestations: Secondary | ICD-10-CM

## 2016-08-18 DIAGNOSIS — J4521 Mild intermittent asthma with (acute) exacerbation: Secondary | ICD-10-CM

## 2016-08-18 DIAGNOSIS — R69 Illness, unspecified: Secondary | ICD-10-CM

## 2016-08-18 MED ORDER — OSELTAMIVIR PHOSPHATE 75 MG PO CAPS: 75.0000 mg | ORAL_CAPSULE | Freq: Two times a day (BID) | ORAL | 0 refills | Status: DC

## 2016-08-18 MED ORDER — GUAIFENESIN-CODEINE 100-10 MG/5ML PO SYRP: 5.0000 mL | ORAL_SOLUTION | Freq: Three times a day (TID) | ORAL | 0 refills | Status: DC | PRN

## 2016-08-18 NOTE — Progress Notes (Signed)
Name: Annette Perez   MRN: [id number]    DOB: 1961-03-25   Date:08/18/2016       Progress Note  Subjective  Chief Complaint  Chief Complaint  Patient presents with  . Sinusitis    headache x 2 days- will not go away, lethargic, but sleeping good at night. 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    Fever   This is a new problem. The current episode started in the past 7 days. The problem occurs intermittently. The problem has been waxing and waning. The maximum temperature noted was 99 to 99.9 F. Associated symptoms include abdominal pain, congestion, coughing, headaches and wheezing. Pertinent negatives include no chest pain, diarrhea, ear pain, muscle aches, nausea, rash, sleepiness, sore throat, urinary pain or vomiting. Associated symptoms comments: Chills/. She has tried NSAIDs for the symptoms. The treatment provided no relief.  Risk factors: sick contacts     No problem-specific Assessment & Plan notes found for this encounter.   Past Medical History:  Diagnosis Date  . Asthma   . Depression     Past Surgical History:  Procedure Laterality Date  . BARIATRIC SURGERY     2011  . CHOLECYSTECTOMY    . HERNIA REPAIR    . HIATAL HERNIA REPAIR     1990's  . KNEE ARTHROSCOPY      Family History  Problem Relation Age of Onset  . Adopted: Yes    Social History   Social History  . Marital status: Single    Spouse name: N/A  . Number of children: N/A  . Years of education: N/A   Occupational History  . Not on file.   Social History Main Topics  . Smoking status: Former Games developer  . Smokeless tobacco: Never Used  . Alcohol use Yes     Comment: socially  . Drug use: No  . Sexual activity: Not on file   Other Topics Concern  . Not on file   Social History Narrative  . No narrative on file    Allergies  Allergen Reactions  . Ibuprofen Other (See Comments)    weightloss surgery     Review of Systems  Constitutional: Positive for fever. Negative for  chills, malaise/fatigue and weight loss.  HENT: Positive for congestion. Negative for ear discharge, ear pain and sore throat.   Eyes: Negative for blurred vision.  Respiratory: Positive for cough and wheezing. Negative for sputum production and shortness of breath.   Cardiovascular: Negative for chest pain, palpitations and leg swelling.  Gastrointestinal: Positive for abdominal pain. Negative for blood in stool, constipation, diarrhea, heartburn, melena, nausea and vomiting.  Genitourinary: Negative for dysuria, frequency, hematuria and urgency.  Musculoskeletal: Positive for myalgias. Negative for back pain, joint pain and neck pain.  Skin: Negative for rash.  Neurological: Positive for headaches. Negative for dizziness, tingling, sensory change and focal weakness.  Endo/Heme/Allergies: Negative for environmental allergies and polydipsia. Does not bruise/bleed easily.  Psychiatric/Behavioral: Negative for depression and suicidal ideas. The patient is not nervous/anxious and does not have insomnia.      Objective  Vitals:   08/18/16 0923  BP: 110/70  Pulse: 76  Temp: 98 F (36.7 C)  TempSrc: Oral  Weight: 274 lb (124.3 kg)  Height: 5\' 6"  (1.676 m)    Physical Exam  Constitutional: She is well-developed, well-nourished, and in no distress. No distress.  HENT:  Head: Normocephalic and atraumatic.  Right Ear: External ear normal.  Left Ear: External ear normal.  Nose: Nose  normal.  Mouth/Throat: Oropharynx is clear and moist.  Eyes: Conjunctivae and EOM are normal. Pupils are equal, round, and reactive to light. Right eye exhibits no discharge. Left eye exhibits no discharge.  Neck: Normal range of motion. Neck supple. No JVD present. No thyromegaly present.  Cardiovascular: Normal rate, regular rhythm, normal heart sounds and intact distal pulses.  Exam reveals no gallop and no friction rub.   No murmur heard. Pulmonary/Chest: Effort normal. She has wheezes. She has no rales.   E/I increased  Abdominal: Soft. Bowel sounds are normal. She exhibits no mass. There is no tenderness. There is no guarding.  Musculoskeletal: Normal range of motion. She exhibits no edema.  Lymphadenopathy:    She has no cervical adenopathy.  Neurological: She is alert. She has normal reflexes.  Skin: Skin is warm and dry. She is not diaphoretic.  Psychiatric: Mood and affect normal.  Nursing note and vitals reviewed.     Assessment & Plan  Problem List Items Addressed This Visit    None    Visit Diagnoses    Influenza-like illness    -  Primary   Relevant Medications   oseltamivir (TAMIFLU) 75 MG capsule   guaiFENesin-codeine (ROBITUSSIN AC) 100-10 MG/5ML syrup   Mild intermittent asthma with acute exacerbation       continue albuterol and advair        Dr. Hayden Rasmusseneanna Essie Lagunes [facility]  08/18/16

## 2016-08-18 NOTE — Patient Instructions (Signed)

## 2016-09-05 ENCOUNTER — Other Ambulatory Visit: Payer: Self-pay | Admitting: Family Medicine

## 2016-09-05 DIAGNOSIS — J4520 Mild intermittent asthma, uncomplicated: Secondary | ICD-10-CM

## 2016-10-07 ENCOUNTER — Other Ambulatory Visit: Payer: Self-pay | Admitting: Family Medicine

## 2016-10-07 DIAGNOSIS — J4520 Mild intermittent asthma, uncomplicated: Secondary | ICD-10-CM

## 2016-10-13 ENCOUNTER — Other Ambulatory Visit: Payer: Self-pay | Admitting: Family Medicine

## 2016-10-13 DIAGNOSIS — J4520 Mild intermittent asthma, uncomplicated: Secondary | ICD-10-CM

## 2016-10-26 ENCOUNTER — Other Ambulatory Visit: Payer: Self-pay | Admitting: Family Medicine

## 2016-11-05 ENCOUNTER — Other Ambulatory Visit: Payer: Self-pay

## 2016-11-22 ENCOUNTER — Encounter: Payer: Self-pay | Admitting: *Deleted

## 2016-11-22 ENCOUNTER — Ambulatory Visit
Admission: EM | Admit: 2016-11-22 | Discharge: 2016-11-22 | Disposition: A | Discharge status: Home / Self Care | Payer: BC Managed Care – PPO | Attending: Family Medicine | Admitting: Family Medicine

## 2016-11-22 ENCOUNTER — Ambulatory Visit: Payer: BC Managed Care – PPO | Admitting: Family Medicine

## 2016-11-22 DIAGNOSIS — K047 Periapical abscess without sinus: Secondary | ICD-10-CM

## 2016-11-22 DIAGNOSIS — K0889 Other specified disorders of teeth and supporting structures: Secondary | ICD-10-CM | POA: Diagnosis not present

## 2016-11-22 MED ORDER — PENICILLIN V POTASSIUM 500 MG PO TABS: 500.0000 mg | ORAL_TABLET | Freq: Four times a day (QID) | ORAL | 0 refills | Status: AC

## 2016-11-22 MED ORDER — OXYCODONE-ACETAMINOPHEN 5-325 MG PO TABS: 1.0000 | ORAL_TABLET | Freq: Three times a day (TID) | ORAL | 0 refills | Status: DC | PRN

## 2016-11-22 NOTE — ED Triage Notes (Signed)
Patient started having lower left jaw dental pain this AM. Left lower molar is visibly decayed.

## 2016-11-22 NOTE — ED Provider Notes (Signed)
[facility] ____________________________________________  Time seen: Approximately 1:06 PM  I have reviewed the triage vital signs and the nursing notes.   HISTORY  Chief Complaint Oral Swelling    HPI Annette Perez is a 56 y.o. female  present for evaluation of left lower dental pain has been present for the last few days with associated left facial swelling. Patient states swelling occurred in the last 24 hours. Reports continues to eat and drink well. Denies any fevers. Denies other known triggers. Patient reports she has had left lower molar has had a previous large filling that came out several years ago but denies any issues. Patient reports tenderness directly around same tooth. Denies cough, congestion, sore throat, vision changes. Reports over-the-counter ibuprofen has helped some, no resolution. Denies any recent antibiotic use. States pain is moderate at this time. Denies chest pain or shortness of breath, abdominal pain. Reports otherwise feels well.  No LMP recorded. Patient has had an ablation.   Past Medical History:  Diagnosis Date  . Asthma   . Depression     There are no active problems to display for this patient.   Past Surgical History:  Procedure Laterality Date  . BARIATRIC SURGERY     2011  . CHOLECYSTECTOMY    . HERNIA REPAIR    . HIATAL HERNIA REPAIR     1990's  . KNEE ARTHROSCOPY      Current Outpatient Rx  . Order #: [id number] Class: Normal  . Order #: [id number] Class: Historical Med  . Order #: [id number] Class: Normal  . Order #: [id number] Class: Normal  . Order #: [id number] Class: Normal  . Order #: [id number] Class: Print  . Order #: [id number] Class: Normal  . Order #: [id number] Class: Print  . Order #: [id number] Class: Normal    Allergies Ibuprofen  Family History  Problem Relation Age of Onset  . Adopted: Yes    Social History Social History  Substance Use Topics  . Smoking status: Former Games developermoker  . Smokeless  tobacco: Never Used  . Alcohol use Yes     Comment: socially    Review of Systems Constitutional: No fever/chills. Reports continues to eat and drink foods and fluids well.  Eyes: No visual changes. ENT: No sore throat. Cardiovascular: Denies chest pain. Respiratory: Denies shortness of breath. Gastrointestinal: No abdominal pain.  No nausea, no vomiting.  Genitourinary: Negative for dysuria. Musculoskeletal: Negative for back pain. Skin: Negative for rash.  ____________________________________________   PHYSICAL EXAM:  VITAL SIGNS: ED Triage Vitals  Enc Vitals Group     BP 11/22/16 1141 (!) 155/77     Pulse Rate 11/22/16 1141 (!) 56     Resp 11/22/16 1141 16     Temp 11/22/16 1141 97.8 F (36.6 C)     Temp Source 11/22/16 1141 Oral     SpO2 11/22/16 1141 98 %     Weight 11/22/16 1143 270 lb (122.5 kg)     Height 11/22/16 1143 5\' 9"  (1.753 m)     Head Circumference --      Peak Flow --      Pain Score 11/22/16 1143 8     Pain Loc --      Pain Edu? --      Excl. in GC? --     Constitutional: Alert and oriented. Well appearing and in no acute distress. Eyes: Conjunctivae are normal. PERRL. EOMI. Head: Atraumatic.No clear facial swelling. No erythema or induration. Full range of motion with opening and closing mouth. Ears: Bilateral ears  no erythema, normal TMs.  Nose: No congestion/rhinnorhea. Mouth/Throat: Mucous membranes are moist.  Oropharynx non-erythematous.No tonsillar swelling or exudate. Periodontal Exam   Moderate tenderness to palpation directly along gumline tooth #17 with tooth #17 with fracture and dental decay, no visualized or palpated abscess, no other dental tenderness noted. Widespread dental decay. Neck: No stridor.  Hematological/Lymphatic/Immunilogical: No cervical lymphadenopathy. Cardiovascular:   Normal rate, regular rhythm. Grossly normal heart sounds. Good peripheral circulation. Respiratory: Normal respiratory effort.  No  retractions. Musculoskeletal: No midline cervical, thoracic or lumbar tenderness to palpation.  Neurologic:  Normal speech and language. No gross focal neurologic deficits are appreciated. Speech is normal. No gait instability. Skin:  Skin is warm, dry. Psychiatric: Mood and affect are normal. Speech and behavior are normal.  ____________________________________________   LABS (all labs ordered are listed, but only abnormal results are displayed)  Labs Reviewed - No data to display ____________________________________________   INITIAL IMPRESSION / ASSESSMENT AND PLAN / ED COURSE  Pertinent labs & imaging results that were available during my care of the patient were reviewed by me and considered in my medical decision making (see chart for details).  Well-appearing patient. No acute distress. Suspect dental infection second to dental decay. Will treat patient with oral Pen-Vee K. No visualized or palpated abscess at this time. Encouraged continued home ibuprofen as needed. Wants to none Percocet given as needed for breakthrough pain. [place] controlled substance database reviewed for last one year, and  10/17/16 last documented of ambien. Discussed indication, risks and benefits of medications with patient.  Patient was advised to see the dentist  as soon as possible. Also advised to take the antibiotic until finished. Instructed to return to the Urgent Care or ER  for symptoms that change or worsen or if unable to schedule an appointment. Patient verbalized understanding and agreed with this plan. ____________________________________________   FINAL CLINICAL IMPRESSION(S) / ED DIAGNOSES  Final diagnoses:  Pain, dental  Dental infection         Renford Dills, NP 11/22/16 1417

## 2016-11-22 NOTE — Discharge Instructions (Signed)
Take medication as prescribed. Rest. Drink plenty of fluids.  ° °Follow up with Dentist as soon as possible.  ° °Follow up with your primary care physician this week as needed. Return to Urgent care for new or worsening concerns.  ° ° °

## 2016-12-26 ENCOUNTER — Other Ambulatory Visit: Payer: Self-pay | Admitting: Family Medicine

## 2016-12-26 DIAGNOSIS — J4520 Mild intermittent asthma, uncomplicated: Secondary | ICD-10-CM

## 2017-02-20 ENCOUNTER — Other Ambulatory Visit: Payer: Self-pay | Admitting: Family Medicine

## 2017-02-20 DIAGNOSIS — J4520 Mild intermittent asthma, uncomplicated: Secondary | ICD-10-CM

## 2017-03-24 ENCOUNTER — Other Ambulatory Visit: Payer: Self-pay | Admitting: Family Medicine

## 2017-03-24 DIAGNOSIS — J4520 Mild intermittent asthma, uncomplicated: Secondary | ICD-10-CM

## 2017-03-25 ENCOUNTER — Other Ambulatory Visit: Payer: Self-pay | Admitting: Family Medicine

## 2017-05-02 ENCOUNTER — Other Ambulatory Visit: Payer: Self-pay | Admitting: Family Medicine

## 2017-05-06 ENCOUNTER — Other Ambulatory Visit: Payer: Self-pay | Admitting: Family Medicine

## 2017-05-06 DIAGNOSIS — J4520 Mild intermittent asthma, uncomplicated: Secondary | ICD-10-CM

## 2017-06-04 ENCOUNTER — Other Ambulatory Visit: Payer: Self-pay | Admitting: Family Medicine

## 2017-07-16 ENCOUNTER — Other Ambulatory Visit: Payer: Self-pay | Admitting: Family Medicine

## 2017-07-20 ENCOUNTER — Other Ambulatory Visit: Payer: Self-pay | Admitting: Family Medicine

## 2017-07-25 ENCOUNTER — Ambulatory Visit: Payer: BC Managed Care – PPO | Admitting: Family Medicine

## 2017-07-25 ENCOUNTER — Encounter: Payer: Self-pay | Admitting: Family Medicine

## 2017-07-25 VITALS — BP 120/78 | HR 60 | Ht 69.0 in | Wt 274.0 lb

## 2017-07-25 DIAGNOSIS — Z1231 Encounter for screening mammogram for malignant neoplasm of breast: Secondary | ICD-10-CM

## 2017-07-25 DIAGNOSIS — J4521 Mild intermittent asthma with (acute) exacerbation: Secondary | ICD-10-CM | POA: Diagnosis not present

## 2017-07-25 DIAGNOSIS — J40 Bronchitis, not specified as acute or chronic: Secondary | ICD-10-CM

## 2017-07-25 DIAGNOSIS — Z1211 Encounter for screening for malignant neoplasm of colon: Secondary | ICD-10-CM

## 2017-07-25 DIAGNOSIS — Z23 Encounter for immunization: Secondary | ICD-10-CM | POA: Diagnosis not present

## 2017-07-25 DIAGNOSIS — F329 Major depressive disorder, single episode, unspecified: Secondary | ICD-10-CM

## 2017-07-25 DIAGNOSIS — Z1239 Encounter for other screening for malignant neoplasm of breast: Secondary | ICD-10-CM

## 2017-07-25 MED ORDER — SERTRALINE HCL 100 MG PO TABS: 100.0000 mg | ORAL_TABLET | Freq: Every day | ORAL | 1 refills | Status: DC

## 2017-07-25 MED ORDER — AZITHROMYCIN 250 MG PO TABS: ORAL_TABLET | ORAL | 0 refills | Status: DC

## 2017-07-25 MED ORDER — FLUTICASONE-SALMETEROL 250-50 MCG/DOSE IN AEPB: INHALATION_SPRAY | RESPIRATORY_TRACT | 11 refills | Status: DC

## 2017-07-25 MED ORDER — ALBUTEROL SULFATE (2.5 MG/3ML) 0.083% IN NEBU: 2.5000 mg | INHALATION_SOLUTION | Freq: Four times a day (QID) | RESPIRATORY_TRACT | 1 refills | Status: DC | PRN

## 2017-07-25 MED ORDER — GUAIFENESIN-CODEINE 100-10 MG/5ML PO SYRP: 5.0000 mL | ORAL_SOLUTION | Freq: Three times a day (TID) | ORAL | 0 refills | Status: DC | PRN

## 2017-07-25 MED ORDER — ALBUTEROL SULFATE HFA 108 (90 BASE) MCG/ACT IN AERS: INHALATION_SPRAY | RESPIRATORY_TRACT | 6 refills | Status: DC

## 2017-07-25 NOTE — Progress Notes (Signed)
Name: Annette Perez   MRN: [id number]    DOB: 1960-10-25   Date:07/25/2017       Progress Note  Subjective  Chief Complaint  Chief Complaint  Patient presents with  . Asthma  . Depression  . Sinusitis    started yesterday with sinus pressure and chest cong/ cough.    Asthma  She complains of cough, sputum production and wheezing. There is no chest tightness, difficulty breathing, frequent throat clearing, hemoptysis, hoarse voice or shortness of breath. This is a recurrent problem. The problem occurs intermittently. The problem has been waxing and waning. The cough is non-productive. Associated symptoms include nasal congestion, postnasal drip and rhinorrhea. Pertinent negatives include no appetite change, chest pain, dyspnea on exertion, ear pain, fever, headaches, heartburn, malaise/fatigue, myalgias, orthopnea, PND, sneezing, sore throat or weight loss. Her symptoms are aggravated by change in weather. Her symptoms are alleviated by beta-agonist and steroid inhaler (laba). She reports moderate improvement on treatment. Her past medical history is significant for asthma. There is no history of bronchiectasis, bronchitis, COPD, emphysema or pneumonia.  Depression       The patient presents with depression.  This is a recurrent problem.  The current episode started more than 1 year ago.   The onset quality is sudden.   The problem occurs intermittently.  Associated symptoms include no decreased concentration, no fatigue, no helplessness, no hopelessness, does not have insomnia, not irritable, no restlessness, no decreased interest, no appetite change, no body aches, no myalgias, no headaches, no indigestion, not sad and no suicidal ideas.     The symptoms are aggravated by nothing.  Past treatments include SSRIs - Selective serotonin reuptake inhibitors.  Compliance with treatment is good.  Previous treatment provided moderate relief.  Past medical history includes depression.     Pertinent negatives  include no chronic fatigue syndrome and no chronic pain. Sinusitis  This is a new problem. The current episode started in the past 7 days. The problem is unchanged. There has been no fever. The pain is moderate. Associated symptoms include coughing. Pertinent negatives include no chills, congestion, diaphoresis, ear pain, headaches, hoarse voice, neck pain, shortness of breath, sinus pressure, sneezing, sore throat or swollen glands. Past treatments include acetaminophen. The treatment provided moderate relief.    No problem-specific Assessment & Plan notes found for this encounter.   Past Medical History:  Diagnosis Date  . Asthma   . Depression     Past Surgical History:  Procedure Laterality Date  . BARIATRIC SURGERY     2011  . CHOLECYSTECTOMY    . HERNIA REPAIR    . HIATAL HERNIA REPAIR     1990's  . KNEE ARTHROSCOPY      Family History  Adopted: Yes    Social History   Socioeconomic History  . Marital status: Single    Spouse name: Not on file  . Number of children: Not on file  . Years of education: Not on file  . Highest education level: Not on file  Social Needs  . Financial resource strain: Not on file  . Food insecurity - worry: Not on file  . Food insecurity - inability: Not on file  . Transportation needs - medical: Not on file  . Transportation needs - non-medical: Not on file  Occupational History  . Not on file  Tobacco Use  . Smoking status: Former Games developer  . Smokeless tobacco: Never Used  Substance and Sexual Activity  . Alcohol use: Yes  Comment: socially  . Drug use: No  . Sexual activity: Not on file  Other Topics Concern  . Not on file  Social History Narrative  . Not on file    Allergies  Allergen Reactions  . Ibuprofen Other (See Comments)    weightloss surgery    Outpatient Medications Prior to Visit  Medication Sig Dispense Refill  . Multiple Vitamin (MULTIVITAMIN) tablet Take 1 tablet by mouth daily.    Marland Kitchen. ADVAIR DISKUS  250-50 MCG/DOSE AEPB INHALE 1 PUFF INTO THE LUNGS TWICE A DAY 60 each 0  . albuterol (PROAIR HFA) 108 (90 Base) MCG/ACT inhaler Inhale 1-2 puffs every 6 hours as needed for SOB 1 Inhaler 1  . albuterol (PROVENTIL) (2.5 MG/3ML) 0.083% nebulizer solution Take 3 mLs (2.5 mg total) by nebulization every 6 (six) hours as needed for wheezing or shortness of breath. 150 mL 1  . PROAIR HFA 108 (90 Base) MCG/ACT inhaler INHALE 1-2 PUFFS INTO THE LUNGS EVERY 6 HOURS AS NEEDED FOR WHEEZING OR SHORTNESS OF BREATH. 18 g 1  . sertraline (ZOLOFT) 100 MG tablet TAKE 1 TABLET BY MOUTH EVERY DAY 15 tablet 0  . guaiFENesin-codeine (ROBITUSSIN AC) 100-10 MG/5ML syrup Take 5 mLs by mouth 3 (three) times daily as needed for cough. 150 mL 0  . oseltamivir (TAMIFLU) 75 MG capsule Take 1 capsule (75 mg total) by mouth 2 (two) times daily. 10 capsule 0  . oxyCODONE-acetaminophen (ROXICET) 5-325 MG tablet Take 1 tablet by mouth every 8 (eight) hours as needed for moderate pain or severe pain (Do not drive or operate heavy machinery while taking as can cause drowsiness.). 9 tablet 0   No facility-administered medications prior to visit.     Review of Systems  Constitutional: Negative for appetite change, chills, diaphoresis, fatigue, fever, malaise/fatigue and weight loss.  HENT: Positive for postnasal drip and rhinorrhea. Negative for congestion, ear discharge, ear pain, hoarse voice, sinus pressure, sneezing and sore throat.   Eyes: Negative for blurred vision.  Respiratory: Positive for cough, sputum production and wheezing. Negative for hemoptysis and shortness of breath.   Cardiovascular: Negative for chest pain, dyspnea on exertion, palpitations, leg swelling and PND.  Gastrointestinal: Negative for abdominal pain, blood in stool, constipation, diarrhea, heartburn, melena and nausea.  Genitourinary: Negative for dysuria, frequency, hematuria and urgency.  Musculoskeletal: Negative for back pain, joint pain, myalgias  and neck pain.  Skin: Negative for rash.  Neurological: Negative for dizziness, tingling, sensory change, focal weakness and headaches.  Endo/Heme/Allergies: Negative for environmental allergies and polydipsia. Does not bruise/bleed easily.  Psychiatric/Behavioral: Positive for depression. Negative for decreased concentration and suicidal ideas. The patient is not nervous/anxious and does not have insomnia.      Objective  Vitals:   07/25/17 0939  BP: 120/78  Pulse: 60  Weight: 274 lb (124.3 kg)  Height: 5\' 9"  (1.753 m)    Physical Exam  Constitutional: She is well-developed, well-nourished, and in no distress. She is not irritable. No distress.  HENT:  Head: Normocephalic and atraumatic.  Right Ear: External ear normal.  Left Ear: External ear normal.  Nose: Nose normal.  Mouth/Throat: Oropharynx is clear and moist.  Eyes: Conjunctivae and EOM are normal. Pupils are equal, round, and reactive to light. Right eye exhibits no discharge. Left eye exhibits no discharge.  Neck: Normal range of motion. Neck supple. No JVD present. No thyromegaly present.  Cardiovascular: Normal rate, regular rhythm, normal heart sounds and intact distal pulses. Exam reveals no gallop and  no friction rub.  No murmur heard. Pulmonary/Chest: Effort normal. She has wheezes. She has no rales.  Abdominal: Soft. Bowel sounds are normal. She exhibits no mass. There is no tenderness. There is no guarding.  Musculoskeletal: Normal range of motion. She exhibits no edema.  Lymphadenopathy:    She has no cervical adenopathy.  Neurological: She is alert. She has normal reflexes.  Skin: Skin is warm and dry. She is not diaphoretic.  Psychiatric: Mood and affect normal.  Nursing note and vitals reviewed.     Assessment & Plan  Problem List Items Addressed This Visit    None    Visit Diagnoses    Mild intermittent reactive airway disease with acute exacerbation    -  Primary   cont bronchodilators    Relevant Medications   albuterol (PROAIR HFA) 108 (90 Base) MCG/ACT inhaler   Fluticasone-Salmeterol (ADVAIR DISKUS) 250-50 MCG/DOSE AEPB   albuterol (PROVENTIL) (2.5 MG/3ML) 0.083% nebulizer solution   guaiFENesin-codeine (ROBITUSSIN AC) 100-10 MG/5ML syrup   Bronchitis       Relevant Medications   azithromycin (ZITHROMAX) 250 MG tablet   guaiFENesin-codeine (ROBITUSSIN AC) 100-10 MG/5ML syrup   Colon cancer screening       Breast screening       Relevant Orders   MM Digital Screening   Reactive depression       Relevant Medications   sertraline (ZOLOFT) 100 MG tablet   Need for diphtheria-tetanus-pertussis (Tdap) vaccine       Relevant Orders   Tdap vaccine greater than or equal to 7yo IM (Completed)      Meds ordered this encounter  Medications  . albuterol (PROAIR HFA) 108 (90 Base) MCG/ACT inhaler    Sig: INHALE 1-2 PUFFS INTO THE LUNGS EVERY 6 HOURS AS NEEDED FOR WHEEZING OR SHORTNESS OF BREATH.    Dispense:  18 g    Refill:  6  . Fluticasone-Salmeterol (ADVAIR DISKUS) 250-50 MCG/DOSE AEPB    Sig: INHALE 1 PUFF INTO THE LUNGS TWICE A DAY    Dispense:  60 each    Refill:  11    LAST time filling- sched appt  . albuterol (PROVENTIL) (2.5 MG/3ML) 0.083% nebulizer solution    Sig: Take 3 mLs (2.5 mg total) by nebulization every 6 (six) hours as needed for wheezing or shortness of breath.    Dispense:  150 mL    Refill:  1  . sertraline (ZOLOFT) 100 MG tablet    Sig: Take 1 tablet (100 mg total) by mouth daily.    Dispense:  90 tablet    Refill:  1    Last time filling- sched appt for this month  . azithromycin (ZITHROMAX) 250 MG tablet    Sig: 2 today then 1 a day for 4 days    Dispense:  6 tablet    Refill:  0  . guaiFENesin-codeine (ROBITUSSIN AC) 100-10 MG/5ML syrup    Sig: Take 5 mLs by mouth 3 (three) times daily as needed for cough.    Dispense:  150 mL    Refill:  0      Dr. Elizabeth Sauer [facility]  07/25/17

## 2017-08-01 ENCOUNTER — Other Ambulatory Visit: Payer: Self-pay | Admitting: *Deleted

## 2017-08-01 ENCOUNTER — Inpatient Hospital Stay
Admission: RE | Admit: 2017-08-01 | Discharge: 2017-08-01 | Disposition: A | Discharge status: Home / Self Care | Payer: Self-pay | Source: Ambulatory Visit | Attending: *Deleted | Admitting: *Deleted

## 2017-08-01 DIAGNOSIS — Z9289 Personal history of other medical treatment: Secondary | ICD-10-CM

## 2017-08-17 ENCOUNTER — Ambulatory Visit: Payer: BC Managed Care – PPO | Admitting: Family Medicine

## 2017-08-17 ENCOUNTER — Encounter: Payer: Self-pay | Admitting: Family Medicine

## 2017-08-17 VITALS — BP 102/64 | HR 64 | Temp 98.3°F | Ht 69.0 in | Wt 274.0 lb

## 2017-08-17 DIAGNOSIS — T700XXA Otitic barotrauma, initial encounter: Secondary | ICD-10-CM

## 2017-08-17 DIAGNOSIS — J40 Bronchitis, not specified as acute or chronic: Secondary | ICD-10-CM

## 2017-08-17 DIAGNOSIS — J0100 Acute maxillary sinusitis, unspecified: Secondary | ICD-10-CM

## 2017-08-17 MED ORDER — GUAIFENESIN-CODEINE 100-10 MG/5ML PO SYRP: 5.0000 mL | ORAL_SOLUTION | Freq: Three times a day (TID) | ORAL | 0 refills | Status: DC | PRN

## 2017-08-17 MED ORDER — AMOXICILLIN 500 MG PO CAPS: 500.0000 mg | ORAL_CAPSULE | Freq: Three times a day (TID) | ORAL | 0 refills | Status: DC

## 2017-08-17 NOTE — Progress Notes (Signed)
Name: Annette Perez   MRN: [id number]    DOB: 01/27/1961   Date:08/17/2017       Progress Note  Subjective  Chief Complaint  Chief Complaint  Patient presents with  . Cough    had a cough x 1 week- R) ear had blood running out of it for a short period of time. sore throat    Cough  This is a new problem. The current episode started 1 to 4 weeks ago (weeks). The problem has been gradually worsening. The cough is productive of sputum. Associated symptoms include myalgias, nasal congestion, postnasal drip and rhinorrhea. Pertinent negatives include no chest pain, chills, ear congestion, ear pain, fever, headaches, heartburn, hemoptysis, rash, sore throat, shortness of breath, sweats, weight loss or wheezing. Associated symptoms comments: Bloody ear discharge right while cough. Treatments tried: azith. The treatment provided mild relief. There is no history of asthma, bronchiectasis, bronchitis, COPD, emphysema, environmental allergies or pneumonia.  Sinusitis  This is a new problem. The current episode started in the past 7 days. The problem has been gradually worsening since onset. There has been no fever. The pain is moderate. Associated symptoms include congestion, coughing and sinus pressure. Pertinent negatives include no chills, ear pain, headaches, neck pain, shortness of breath or sore throat. Past treatments include antibiotics.    No problem-specific Assessment & Plan notes found for this encounter.   Past Medical History:  Diagnosis Date  . Asthma   . Depression     Past Surgical History:  Procedure Laterality Date  . BARIATRIC SURGERY     2011  . CHOLECYSTECTOMY    . HERNIA REPAIR    . HIATAL HERNIA REPAIR     1990's  . KNEE ARTHROSCOPY      Family History  Adopted: Yes    Social History   Socioeconomic History  . Marital status: Single    Spouse name: Not on file  . Number of children: Not on file  . Years of education: Not on file  . Highest education level:  Not on file  Social Needs  . Financial resource strain: Not on file  . Food insecurity - worry: Not on file  . Food insecurity - inability: Not on file  . Transportation needs - medical: Not on file  . Transportation needs - non-medical: Not on file  Occupational History  . Not on file  Tobacco Use  . Smoking status: Former Games developer  . Smokeless tobacco: Never Used  Substance and Sexual Activity  . Alcohol use: Yes    Comment: socially  . Drug use: No  . Sexual activity: Not on file  Other Topics Concern  . Not on file  Social History Narrative  . Not on file    Allergies  Allergen Reactions  . Ibuprofen Other (See Comments)    weightloss surgery    Outpatient Medications Prior to Visit  Medication Sig Dispense Refill  . albuterol (PROAIR HFA) 108 (90 Base) MCG/ACT inhaler INHALE 1-2 PUFFS INTO THE LUNGS EVERY 6 HOURS AS NEEDED FOR WHEEZING OR SHORTNESS OF BREATH. 18 g 6  . albuterol (PROVENTIL) (2.5 MG/3ML) 0.083% nebulizer solution Take 3 mLs (2.5 mg total) by nebulization every 6 (six) hours as needed for wheezing or shortness of breath. 150 mL 1  . Fluticasone-Salmeterol (ADVAIR DISKUS) 250-50 MCG/DOSE AEPB INHALE 1 PUFF INTO THE LUNGS TWICE A DAY 60 each 11  . Multiple Vitamin (MULTIVITAMIN) tablet Take 1 tablet by mouth daily.    . sertraline (  ZOLOFT) 100 MG tablet Take 1 tablet (100 mg total) by mouth daily. 90 tablet 1  . azithromycin (ZITHROMAX) 250 MG tablet 2 today then 1 a day for 4 days 6 tablet 0  . guaiFENesin-codeine (ROBITUSSIN AC) 100-10 MG/5ML syrup Take 5 mLs by mouth 3 (three) times daily as needed for cough. 150 mL 0   No facility-administered medications prior to visit.     Review of Systems  Constitutional: Negative for chills, fever, malaise/fatigue and weight loss.  HENT: Positive for congestion, ear discharge, postnasal drip, rhinorrhea, sinus pressure and sinus pain. Negative for ear pain, hearing loss, nosebleeds, sore throat and tinnitus.    Eyes: Negative for blurred vision.  Respiratory: Positive for cough. Negative for hemoptysis, sputum production, shortness of breath, wheezing and stridor.   Cardiovascular: Negative for chest pain, palpitations and leg swelling.  Gastrointestinal: Negative for abdominal pain, blood in stool, constipation, diarrhea, heartburn, melena and nausea.  Genitourinary: Negative for dysuria, frequency, hematuria and urgency.  Musculoskeletal: Positive for myalgias. Negative for back pain, joint pain and neck pain.  Skin: Negative for rash.  Neurological: Negative for dizziness, tingling, sensory change, focal weakness and headaches.  Endo/Heme/Allergies: Negative for environmental allergies and polydipsia. Does not bruise/bleed easily.  Psychiatric/Behavioral: Negative for depression and suicidal ideas. The patient is not nervous/anxious and does not have insomnia.      Objective  Vitals:   08/17/17 0810  BP: 102/64  Pulse: 64  Temp: 98.3 F (36.8 C)  TempSrc: Oral  Weight: 274 lb (124.3 kg)  Height: 5\' 9"  (1.753 m)    Physical Exam  Constitutional: She is well-developed, well-nourished, and in no distress. No distress.  HENT:  Head: Normocephalic and atraumatic.  Right Ear: External ear normal. Tympanic membrane is perforated and retracted. There is hemotympanum.  Left Ear: Tympanic membrane, external ear and ear canal normal.  Nose: Mucosal edema present. Right sinus exhibits maxillary sinus tenderness. Left sinus exhibits maxillary sinus tenderness.  Mouth/Throat: Posterior oropharyngeal erythema present. No oropharyngeal exudate or posterior oropharyngeal edema.  Eyes: Conjunctivae and EOM are normal. Pupils are equal, round, and reactive to light. Right eye exhibits no discharge. Left eye exhibits no discharge.  Neck: Normal range of motion. Neck supple. No JVD present. No thyromegaly present.  Cardiovascular: Normal rate, regular rhythm, normal heart sounds and intact distal  pulses. Exam reveals no gallop and no friction rub.  No murmur heard. Pulmonary/Chest: Effort normal and breath sounds normal. She has no wheezes. She has no rales.  Abdominal: Soft. Bowel sounds are normal. She exhibits no mass. There is no tenderness. There is no guarding.  Musculoskeletal: Normal range of motion. She exhibits no edema.  Lymphadenopathy:    She has no cervical adenopathy.  Neurological: She is alert. She has normal reflexes.  Skin: Skin is warm and dry. She is not diaphoretic.  Psychiatric: Mood and affect normal.  Nursing note and vitals reviewed.     Assessment & Plan  Problem List Items Addressed This Visit    None    Visit Diagnoses    Acute maxillary sinusitis, recurrence not specified    -  Primary   Relevant Medications   amoxicillin (AMOXIL) 500 MG capsule   guaiFENesin-codeine (ROBITUSSIN AC) 100-10 MG/5ML syrup   Otitic barotrauma, initial encounter       Relevant Medications   amoxicillin (AMOXIL) 500 MG capsule   Bronchitis       Relevant Medications   guaiFENesin-codeine (ROBITUSSIN AC) 100-10 MG/5ML syrup  Meds ordered this encounter  Medications  . amoxicillin (AMOXIL) 500 MG capsule    Sig: Take 1 capsule (500 mg total) by mouth 3 (three) times daily.    Dispense:  30 capsule    Refill:  0  . guaiFENesin-codeine (ROBITUSSIN AC) 100-10 MG/5ML syrup    Sig: Take 5 mLs by mouth 3 (three) times daily as needed for cough.    Dispense:  100 mL    Refill:  0      Dr. Elizabeth Sauer [facility]  08/17/17

## 2017-10-18 ENCOUNTER — Ambulatory Visit: Payer: BC Managed Care – PPO | Admitting: Family Medicine

## 2017-10-18 ENCOUNTER — Ambulatory Visit
Admission: RE | Admit: 2017-10-18 | Discharge: 2017-10-18 | Disposition: A | Discharge status: Home / Self Care | Payer: BC Managed Care – PPO | Source: Ambulatory Visit | Attending: Family Medicine | Admitting: Family Medicine

## 2017-10-18 ENCOUNTER — Encounter: Payer: Self-pay | Admitting: Family Medicine

## 2017-10-18 VITALS — BP 120/80 | HR 64 | Ht 69.0 in | Wt 276.0 lb

## 2017-10-18 DIAGNOSIS — S92511A Displaced fracture of proximal phalanx of right lesser toe(s), initial encounter for closed fracture: Secondary | ICD-10-CM | POA: Diagnosis not present

## 2017-10-18 DIAGNOSIS — S9781XA Crushing injury of right foot, initial encounter: Secondary | ICD-10-CM

## 2017-10-18 DIAGNOSIS — X58XXXA Exposure to other specified factors, initial encounter: Secondary | ICD-10-CM | POA: Diagnosis not present

## 2017-10-18 MED ORDER — TRAMADOL HCL 50 MG PO TABS: 50.0000 mg | ORAL_TABLET | Freq: Four times a day (QID) | ORAL | 0 refills | Status: DC | PRN

## 2017-10-18 NOTE — Progress Notes (Signed)
Name: Annette Perez   MRN: [id number]    DOB: 01/28/1961   Date:10/18/2017       Progress Note  Subjective  Chief Complaint  Chief Complaint  Patient presents with  . Foot Pain    wearing flip flops, caught pinky toe on door when opened door. Swelling, discolored and painful x 1 week    Gracefully she opened the heavy door and crammed it into her "ortho" flip-flop "supported" right foot.  Foot Pain  This is a new problem. The current episode started in the past 7 days (last Tuesday). The problem occurs constantly. The problem has been waxing and waning. Pertinent negatives include no abdominal pain, chest pain, chills, coughing, fever, headaches, myalgias, nausea, neck pain, numbness, rash, sore throat or weakness. The symptoms are aggravated by standing and walking. She has tried NSAIDs for the symptoms. The treatment provided moderate relief.    No problem-specific Assessment & Plan notes found for this encounter.   Past Medical History:  Diagnosis Date  . Asthma   . Depression     Past Surgical History:  Procedure Laterality Date  . BARIATRIC SURGERY     2011  . CHOLECYSTECTOMY    . HERNIA REPAIR    . HIATAL HERNIA REPAIR     1990's  . KNEE ARTHROSCOPY      Family History  Adopted: Yes    Social History   Socioeconomic History  . Marital status: Single    Spouse name: Not on file  . Number of children: Not on file  . Years of education: Not on file  . Highest education level: Not on file  Occupational History  . Not on file  Social Needs  . Financial resource strain: Not on file  . Food insecurity:    Worry: Not on file    Inability: Not on file  . Transportation needs:    Medical: Not on file    Non-medical: Not on file  Tobacco Use  . Smoking status: Former Games developermoker  . Smokeless tobacco: Never Used  Substance and Sexual Activity  . Alcohol use: Yes    Comment: socially  . Drug use: No  . Sexual activity: Not on file  Lifestyle  . Physical  activity:    Days per week: Not on file    Minutes per session: Not on file  . Stress: Not on file  Relationships  . Social connections:    Talks on phone: Not on file    Gets together: Not on file    Attends religious service: Not on file    Active member of club or organization: Not on file    Attends meetings of clubs or organizations: Not on file    Relationship status: Not on file  . Intimate partner violence:    Fear of current or ex partner: Not on file    Emotionally abused: Not on file    Physically abused: Not on file    Forced sexual activity: Not on file  Other Topics Concern  . Not on file  Social History Narrative  . Not on file    Allergies  Allergen Reactions  . Ibuprofen Other (See Comments)    weightloss surgery    Outpatient Medications Prior to Visit  Medication Sig Dispense Refill  . albuterol (PROAIR HFA) 108 (90 Base) MCG/ACT inhaler INHALE 1-2 PUFFS INTO THE LUNGS EVERY 6 HOURS AS NEEDED FOR WHEEZING OR SHORTNESS OF BREATH. 18 g 6  . albuterol (PROVENTIL) (2.5  MG/3ML) 0.083% nebulizer solution Take 3 mLs (2.5 mg total) by nebulization every 6 (six) hours as needed for wheezing or shortness of breath. 150 mL 1  . Fluticasone-Salmeterol (ADVAIR DISKUS) 250-50 MCG/DOSE AEPB INHALE 1 PUFF INTO THE LUNGS TWICE A DAY 60 each 11  . Multiple Vitamin (MULTIVITAMIN) tablet Take 1 tablet by mouth daily.    . sertraline (ZOLOFT) 100 MG tablet Take 1 tablet (100 mg total) by mouth daily. 90 tablet 1  . amoxicillin (AMOXIL) 500 MG capsule Take 1 capsule (500 mg total) by mouth 3 (three) times daily. 30 capsule 0  . guaiFENesin-codeine (ROBITUSSIN AC) 100-10 MG/5ML syrup Take 5 mLs by mouth 3 (three) times daily as needed for cough. 100 mL 0   No facility-administered medications prior to visit.     Review of Systems  Constitutional: Negative for chills, fever, malaise/fatigue and weight loss.  HENT: Negative for ear discharge, ear pain and sore throat.   Eyes:  Negative for blurred vision.  Respiratory: Negative for cough, sputum production, shortness of breath and wheezing.   Cardiovascular: Negative for chest pain, palpitations and leg swelling.  Gastrointestinal: Negative for abdominal pain, blood in stool, constipation, diarrhea, heartburn, melena and nausea.  Genitourinary: Negative for dysuria, frequency, hematuria and urgency.  Musculoskeletal: Negative for back pain, joint pain, myalgias and neck pain.  Skin: Negative for rash.  Neurological: Negative for dizziness, tingling, sensory change, focal weakness, weakness, numbness and headaches.  Endo/Heme/Allergies: Negative for environmental allergies and polydipsia. Does not bruise/bleed easily.  Psychiatric/Behavioral: Negative for depression and suicidal ideas. The patient is not nervous/anxious and does not have insomnia.      Objective  Vitals:   10/18/17 1034  BP: 120/80  Pulse: 64  Weight: 276 lb (125.2 kg)  Height: 5\' 9"  (1.753 m)    Physical Exam  Constitutional: She appears well-developed. No distress.  HENT:  Head: Normocephalic and atraumatic.  Right Ear: External ear normal.  Left Ear: External ear normal.  Nose: Nose normal.  Mouth/Throat: Oropharynx is clear and moist.  Eyes: Pupils are equal, round, and reactive to light. Conjunctivae and EOM are normal. Right eye exhibits no discharge. Left eye exhibits no discharge.  Neck: Normal range of motion. Neck supple. No JVD present. No thyromegaly present.  Cardiovascular: Normal rate, regular rhythm, normal heart sounds and intact distal pulses. Exam reveals no gallop and no friction rub.  No murmur heard. Pulmonary/Chest: Effort normal and breath sounds normal.  Abdominal: Soft. Bowel sounds are normal. She exhibits no mass. There is no tenderness. There is no guarding.  Musculoskeletal: Normal range of motion. She exhibits no edema.       Right foot: There is tenderness. There is normal range of motion and no  deformity.       Left foot: There is normal range of motion and no deformity.       Feet:  Feet:  Right Foot:  Skin Integrity: Positive for warmth. Negative for skin breakdown, erythema or dry skin.  Lymphadenopathy:    She has no cervical adenopathy.  Neurological: She is alert. She has normal reflexes.  Skin: Skin is warm and dry. Capillary refill takes less than 2 seconds. Ecchymosis noted. She is not diaphoretic. No erythema.  Nursing note and vitals reviewed.     Assessment & Plan  Problem List Items Addressed This Visit    None    Visit Diagnoses    Crushing injury of right foot, initial encounter    -  Primary  Relevant Medications   traMADol (ULTRAM) 50 MG tablet   Other Relevant Orders   DG Foot Complete Right      Meds ordered this encounter  Medications  . traMADol (ULTRAM) 50 MG tablet    Sig: Take 1 tablet (50 mg total) by mouth every 6 (six) hours as needed.    Dispense:  20 tablet    Refill:  0      Dr. [facility]  10/18/17

## 2017-11-05 ENCOUNTER — Other Ambulatory Visit: Payer: Self-pay | Admitting: Family Medicine

## 2017-11-05 DIAGNOSIS — J4520 Mild intermittent asthma, uncomplicated: Secondary | ICD-10-CM

## 2017-11-29 ENCOUNTER — Ambulatory Visit: Payer: BC Managed Care – PPO | Admitting: Family Medicine

## 2017-11-29 ENCOUNTER — Encounter: Payer: Self-pay | Admitting: Family Medicine

## 2017-11-29 VITALS — BP 120/80 | HR 80 | Ht 69.0 in | Wt 278.0 lb

## 2017-11-29 DIAGNOSIS — J219 Acute bronchiolitis, unspecified: Secondary | ICD-10-CM | POA: Diagnosis not present

## 2017-11-29 DIAGNOSIS — Z77098 Contact with and (suspected) exposure to other hazardous, chiefly nonmedicinal, chemicals: Secondary | ICD-10-CM

## 2017-11-29 DIAGNOSIS — J4521 Mild intermittent asthma with (acute) exacerbation: Secondary | ICD-10-CM

## 2017-11-29 MED ORDER — PREDNISONE 10 MG PO TABS
10.0000 mg | ORAL_TABLET | Freq: Every day | ORAL | 0 refills | Status: DC
Start: 2017-11-29 — End: 2017-12-12

## 2017-11-29 MED ORDER — GUAIFENESIN-CODEINE 100-10 MG/5ML PO SYRP: 5.0000 mL | ORAL_SOLUTION | Freq: Three times a day (TID) | ORAL | 0 refills | Status: DC | PRN

## 2017-11-29 MED ORDER — IPRATROPIUM-ALBUTEROL 0.5-2.5 (3) MG/3ML IN SOLN: 3.0000 mL | Freq: Once | RESPIRATORY_TRACT | Status: AC

## 2017-11-29 MED ORDER — IPRATROPIUM-ALBUTEROL 0.5-2.5 (3) MG/3ML IN SOLN: 3.0000 mL | Freq: Four times a day (QID) | RESPIRATORY_TRACT | Status: DC

## 2017-11-29 MED ORDER — IPRATROPIUM BROMIDE 0.02 % IN SOLN: 0.5000 mg | Freq: Four times a day (QID) | RESPIRATORY_TRACT | 12 refills | Status: DC

## 2017-11-29 NOTE — Addendum Note (Signed)
Addended by: Everitt Amber on: 11/29/2017 04:46 PM   Modules accepted: Orders

## 2017-11-29 NOTE — Progress Notes (Addendum)
Name: Annette Perez   MRN: [id number]    DOB: [birth year]   Date:11/29/2017       Progress Note  Subjective  Chief Complaint  Chief Complaint  Patient presents with  . Asthma    spouse was sick and then was breathing bleach- has aggravated her asthma. has been using her nebulizer- not getting better    Asthma  She complains of chest tightness, cough, difficulty breathing, frequent throat clearing, hoarse voice, shortness of breath, sputum production and wheezing. There is no hemoptysis. This is a chronic problem. The current episode started more than 1 year ago. The problem occurs constantly. The problem has been waxing and waning. The cough is productive of sputum (clear). Associated symptoms include postnasal drip. Pertinent negatives include no chest pain, dyspnea on exertion, ear pain, fever, headaches, heartburn, malaise/fatigue, myalgias, nasal congestion, rhinorrhea, sore throat or weight loss. Exacerbated by: exposure chlorine/bleach. Her symptoms are not alleviated by beta-agonist. Her past medical history is significant for asthma.    No problem-specific Assessment & Plan notes found for this encounter.   Past Medical History:  Diagnosis Date  . Asthma   . Depression     Past Surgical History:  Procedure Laterality Date  . BARIATRIC SURGERY     2011  . CHOLECYSTECTOMY    . HERNIA REPAIR    . HIATAL HERNIA REPAIR     1990's  . KNEE ARTHROSCOPY      Family History  Adopted: Yes    Social History   Socioeconomic History  . Marital status: Single    Spouse name: Not on file  . Number of children: Not on file  . Years of education: Not on file  . Highest education level: Not on file  Occupational History  . Not on file  Social Needs  . Financial resource strain: Not on file  . Food insecurity:    Worry: Not on file    Inability: Not on file  . Transportation needs:    Medical: Not on file    Non-medical: Not on file  Tobacco Use  . Smoking status: Former  Games developer  . Smokeless tobacco: Never Used  Substance and Sexual Activity  . Alcohol use: Yes    Comment: socially  . Drug use: No  . Sexual activity: Not on file  Lifestyle  . Physical activity:    Days per week: Not on file    Minutes per session: Not on file  . Stress: Not on file  Relationships  . Social connections:    Talks on phone: Not on file    Gets together: Not on file    Attends religious service: Not on file    Active member of club or organization: Not on file    Attends meetings of clubs or organizations: Not on file    Relationship status: Not on file  . Intimate partner violence:    Fear of current or ex partner: Not on file    Emotionally abused: Not on file    Physically abused: Not on file    Forced sexual activity: Not on file  Other Topics Concern  . Not on file  Social History Narrative  . Not on file    Allergies  Allergen Reactions  . Ibuprofen Other (See Comments)    weightloss surgery    Outpatient Medications Prior to Visit  Medication Sig Dispense Refill  . albuterol (PROVENTIL) (2.5 MG/3ML) 0.083% nebulizer solution Take 3 mLs (2.5 mg total) by nebulization  every 6 (six) hours as needed for wheezing or shortness of breath. 150 mL 1  . Fluticasone-Salmeterol (ADVAIR DISKUS) 250-50 MCG/DOSE AEPB INHALE 1 PUFF INTO THE LUNGS TWICE A DAY 60 each 11  . Multiple Vitamin (MULTIVITAMIN) tablet Take 1 tablet by mouth daily.    . sertraline (ZOLOFT) 100 MG tablet Take 1 tablet (100 mg total) by mouth daily. 90 tablet 1  . albuterol (PROAIR HFA) 108 (90 Base) MCG/ACT inhaler INHALE 1-2 PUFFS INTO THE LUNGS EVERY 6 HOURS AS NEEDED FOR WHEEZING OR SHORTNESS OF BREATH. 18 g 6  . albuterol (PROVENTIL HFA;VENTOLIN HFA) 108 (90 Base) MCG/ACT inhaler INHALE 1 TO 2 PUFFS INTO THE LUNGS EVERY 6 HOURS AS NEEDED FOR SHORTNESS OF BREATH 8.5 Inhaler 1  . traMADol (ULTRAM) 50 MG tablet Take 1 tablet (50 mg total) by mouth every 6 (six) hours as needed. (Patient not  taking: Reported on 11/29/2017) 20 tablet 0   No facility-administered medications prior to visit.     Review of Systems  Constitutional: Negative for chills, fever, malaise/fatigue and weight loss.  HENT: Positive for hoarse voice and postnasal drip. Negative for ear discharge, ear pain, rhinorrhea and sore throat.   Eyes: Negative for blurred vision.  Respiratory: Positive for cough, sputum production, shortness of breath and wheezing. Negative for hemoptysis.   Cardiovascular: Negative for chest pain, dyspnea on exertion, palpitations and leg swelling.  Gastrointestinal: Negative for abdominal pain, blood in stool, constipation, diarrhea, heartburn, melena and nausea.  Genitourinary: Negative for dysuria, frequency, hematuria and urgency.  Musculoskeletal: Negative for back pain, joint pain, myalgias and neck pain.  Skin: Negative for rash.  Neurological: Negative for dizziness, tingling, sensory change, focal weakness and headaches.  Endo/Heme/Allergies: Negative for environmental allergies and polydipsia. Does not bruise/bleed easily.  Psychiatric/Behavioral: Negative for depression and suicidal ideas. The patient is not nervous/anxious and does not have insomnia.      Objective  Vitals:   11/29/17 1415  BP: 120/80  Pulse: 80  SpO2: 98%  Weight: 278 lb (126.1 kg)  Height:  (1.753 m)    Physical Exam  Constitutional: She is oriented to person, place, and time. She appears well-developed and well-nourished.  HENT:  Head: Normocephalic.  Right Ear: External ear normal.  Left Ear: External ear normal.  Mouth/Throat: Oropharynx is clear and moist.  Eyes: Pupils are equal, round, and reactive to light. Conjunctivae and EOM are normal. Lids are everted and swept, no foreign bodies found. Left eye exhibits no hordeolum. No foreign body present in the left eye. Right conjunctiva is not injected. Left conjunctiva is not injected. No scleral icterus.  Neck: Normal range of  motion. Neck supple. No JVD present. No tracheal deviation present. No thyromegaly present.  Cardiovascular: Normal rate, regular rhythm, normal heart sounds and intact distal pulses. Exam reveals no gallop and no friction rub.  No murmur heard. Pulmonary/Chest: Effort normal. No respiratory distress. She has no decreased breath sounds. She has wheezes. She has rhonchi. She has no rales.  Abdominal: Soft. Bowel sounds are normal. She exhibits no mass. There is no hepatosplenomegaly. There is no tenderness. There is no rebound and no guarding.  Musculoskeletal: Normal range of motion. She exhibits no edema or tenderness.  Lymphadenopathy:    She has no cervical adenopathy.  Neurological: She is alert and oriented to person, place, and time. She has normal strength. She displays normal reflexes. No cranial nerve deficit.  Skin: Skin is warm. No rash noted.  Psychiatric: She has  a normal mood and affect. Her mood appears not anxious. She does not exhibit a depressed mood.  Nursing note and vitals reviewed.     Assessment & Plan  Problem List Items Addressed This Visit    None    Visit Diagnoses    Mild intermittent asthma with acute exacerbation    -  Primary   acute exacerbation with chorine exposure   Relevant Medications   ipratropium-albuterol (DUONEB) 0.5-2.5 (3) MG/3ML nebulizer solution 3 mL   ipratropium-albuterol (DUONEB) 0.5-2.5 (3) MG/3ML nebulizer solution 3 mL   predniSONE (DELTASONE) 10 MG tablet   guaiFENesin-codeine (ROBITUSSIN AC) 100-10 MG/5ML syrup   Bronchiolitis       Relevant Medications   guaiFENesin-codeine (ROBITUSSIN AC) 100-10 MG/5ML syrup   Chlorine gas exposure       aerosolize diluted bleach cleaning pool      Meds ordered this encounter  Medications  . ipratropium-albuterol (DUONEB) 0.5-2.5 (3) MG/3ML nebulizer solution 3 mL  . ipratropium-albuterol (DUONEB) 0.5-2.5 (3) MG/3ML nebulizer solution 3 mL  . predniSONE (DELTASONE) 10 MG tablet    Sig:  Take 1 tablet (10 mg total) by mouth daily with breakfast.    Dispense:  30 tablet    Refill:  0  . guaiFENesin-codeine (ROBITUSSIN AC) 100-10 MG/5ML syrup    Sig: Take 5 mLs by mouth 3 (three) times daily as needed for cough.    Dispense:  150 mL    Refill:  0   Breathing improved after breathing treatment/subjectively and objectively   Dr. Elizabeth Sauer [facility]  11/29/17

## 2017-12-05 ENCOUNTER — Emergency Department (HOSPITAL_COMMUNITY)
Admission: EM | Admit: 2017-12-05 | Discharge: 2017-12-06 | Disposition: A | Discharge status: Home / Self Care | Payer: BC Managed Care – PPO | Attending: Emergency Medicine | Admitting: Emergency Medicine

## 2017-12-05 ENCOUNTER — Ambulatory Visit: Payer: BC Managed Care – PPO | Admitting: Family Medicine

## 2017-12-05 ENCOUNTER — Encounter: Payer: Self-pay | Admitting: Family Medicine

## 2017-12-05 ENCOUNTER — Encounter (HOSPITAL_COMMUNITY): Payer: Self-pay

## 2017-12-05 ENCOUNTER — Ambulatory Visit
Admission: RE | Admit: 2017-12-05 | Discharge: 2017-12-05 | Disposition: A | Discharge status: Home / Self Care | Payer: BC Managed Care – PPO | Source: Ambulatory Visit | Attending: Family Medicine | Admitting: Family Medicine

## 2017-12-05 VITALS — BP 130/90 | HR 64 | Ht 69.0 in | Wt 279.0 lb

## 2017-12-05 DIAGNOSIS — J984 Other disorders of lung: Secondary | ICD-10-CM | POA: Diagnosis not present

## 2017-12-05 DIAGNOSIS — Z87891 Personal history of nicotine dependence: Secondary | ICD-10-CM | POA: Diagnosis not present

## 2017-12-05 DIAGNOSIS — J4541 Moderate persistent asthma with (acute) exacerbation: Secondary | ICD-10-CM | POA: Diagnosis not present

## 2017-12-05 DIAGNOSIS — J4521 Mild intermittent asthma with (acute) exacerbation: Secondary | ICD-10-CM

## 2017-12-05 DIAGNOSIS — Z79899 Other long term (current) drug therapy: Secondary | ICD-10-CM | POA: Diagnosis not present

## 2017-12-05 DIAGNOSIS — I700 Atherosclerosis of aorta: Secondary | ICD-10-CM | POA: Diagnosis not present

## 2017-12-05 DIAGNOSIS — R062 Wheezing: Secondary | ICD-10-CM | POA: Insufficient documentation

## 2017-12-05 DIAGNOSIS — J40 Bronchitis, not specified as acute or chronic: Secondary | ICD-10-CM | POA: Diagnosis not present

## 2017-12-05 DIAGNOSIS — R05 Cough: Secondary | ICD-10-CM | POA: Diagnosis present

## 2017-12-05 LAB — CBC
HEMATOCRIT: 39.9 % (ref 36.0–46.0)
HEMOGLOBIN: 13.9 g/dL (ref 12.0–15.0)
MCH: 30 pg (ref 26.0–34.0)
MCHC: 34.8 g/dL (ref 30.0–36.0)
MCV: 86.2 fL (ref 78.0–100.0)
Platelets: 285 10*3/uL (ref 150–400)
RBC: 4.63 MIL/uL (ref 3.87–5.11)
RDW: 11.7 % (ref 11.5–15.5)
WBC: 10.2 10*3/uL (ref 4.0–10.5)

## 2017-12-05 LAB — BASIC METABOLIC PANEL
ANION GAP: 7 (ref 5–15)
BUN: 11 mg/dL (ref 6–20)
CALCIUM: 9 mg/dL (ref 8.9–10.3)
CHLORIDE: 109 mmol/L (ref 101–111)
CO2: 23 mmol/L (ref 22–32)
Creatinine, Ser: 0.66 mg/dL (ref 0.44–1.00)
GFR calc Af Amer: >60 mL/min (ref >60)
GFR calc non Af Amer: >60 mL/min (ref >60)
GLUCOSE: 98 mg/dL (ref 65–99)
Potassium: 3.8 mmol/L (ref 3.5–5.1)
Sodium: 139 mmol/L (ref 135–145)

## 2017-12-05 MED ORDER — ALBUTEROL SULFATE (2.5 MG/3ML) 0.083% IN NEBU
5.0000 mg | INHALATION_SOLUTION | Freq: Once | RESPIRATORY_TRACT | Status: AC
  Administered 2017-12-05: 5 mg via RESPIRATORY_TRACT
  Filled 2017-12-05: qty 6

## 2017-12-05 MED ORDER — BENZONATATE 100 MG PO CAPS: 100.0000 mg | ORAL_CAPSULE | Freq: Two times a day (BID) | ORAL | 0 refills | Status: DC | PRN

## 2017-12-05 MED ORDER — LEVOFLOXACIN 500 MG PO TABS: 500.0000 mg | ORAL_TABLET | Freq: Every day | ORAL | 0 refills | Status: DC

## 2017-12-05 MED ORDER — PREDNISONE 10 MG PO TABS: ORAL_TABLET | ORAL | 1 refills | Status: DC

## 2017-12-05 NOTE — ED Triage Notes (Signed)
Pt states that for the past week she has been having SOB from her asthma and dry cough, unrelieved by medications from PCP

## 2017-12-05 NOTE — Progress Notes (Signed)
Name: Annette Perez   MRN: [id number]    DOB: [birth year]   Date:12/05/2017       Progress Note  Subjective  Chief Complaint  Chief Complaint  Patient presents with  . Sinusitis    on prednisone and breathing treatments. now has the green production    Sinusitis  This is a new problem. The problem is unchanged. There has been no fever. The pain is mild. Associated symptoms include chills, congestion, coughing, diaphoresis, headaches, a hoarse voice, shortness of breath, sinus pressure and sneezing. Pertinent negatives include no ear pain, neck pain, sore throat or swollen glands. Treatments tried: prednisone/duoneb/advair/ The treatment provided no relief.  Cough  This is a new problem. The current episode started 1 to 4 weeks ago. The problem has been gradually worsening. The cough is productive of purulent sputum (dark green). Associated symptoms include chills, headaches, nasal congestion, postnasal drip, rhinorrhea, shortness of breath and wheezing. Pertinent negatives include no chest pain, ear pain, fever, heartburn, hemoptysis, myalgias, rash, sore throat, sweats or weight loss. Nothing aggravates the symptoms. She has tried a beta-agonist inhaler and oral steroids for the symptoms. The treatment provided moderate relief. Her past medical history is significant for asthma and bronchitis. There is no history of environmental allergies.    No problem-specific Assessment & Plan notes found for this encounter.   Past Medical History:  Diagnosis Date  . Asthma   . Depression     Past Surgical History:  Procedure Laterality Date  . BARIATRIC SURGERY     2011  . CHOLECYSTECTOMY    . HERNIA REPAIR    . HIATAL HERNIA REPAIR     1990's  . KNEE ARTHROSCOPY      Family History  Adopted: Yes    Social History   Socioeconomic History  . Marital status: Single    Spouse name: Not on file  . Number of children: Not on file  . Years of education: Not on file  . Highest education  level: Not on file  Occupational History  . Not on file  Social Needs  . Financial resource strain: Not on file  . Food insecurity:    Worry: Not on file    Inability: Not on file  . Transportation needs:    Medical: Not on file    Non-medical: Not on file  Tobacco Use  . Smoking status: Former Games developer  . Smokeless tobacco: Never Used  Substance and Sexual Activity  . Alcohol use: Yes    Comment: socially  . Drug use: No  . Sexual activity: Not on file  Lifestyle  . Physical activity:    Days per week: Not on file    Minutes per session: Not on file  . Stress: Not on file  Relationships  . Social connections:    Talks on phone: Not on file    Gets together: Not on file    Attends religious service: Not on file    Active member of club or organization: Not on file    Attends meetings of clubs or organizations: Not on file    Relationship status: Not on file  . Intimate partner violence:    Fear of current or ex partner: Not on file    Emotionally abused: Not on file    Physically abused: Not on file    Forced sexual activity: Not on file  Other Topics Concern  . Not on file  Social History Narrative  . Not on file  Allergies  Allergen Reactions  . Ibuprofen Other (See Comments)    weightloss surgery    Outpatient Medications Prior to Visit  Medication Sig Dispense Refill  . albuterol (PROVENTIL) (2.5 MG/3ML) 0.083% nebulizer solution Take 3 mLs (2.5 mg total) by nebulization every 6 (six) hours as needed for wheezing or shortness of breath. 150 mL 1  . Fluticasone-Salmeterol (ADVAIR DISKUS) 250-50 MCG/DOSE AEPB INHALE 1 PUFF INTO THE LUNGS TWICE A DAY 60 each 11  . guaiFENesin-codeine (ROBITUSSIN AC) 100-10 MG/5ML syrup Take 5 mLs by mouth 3 (three) times daily as needed for cough. 150 mL 0  . ipratropium (ATROVENT) 0.02 % nebulizer solution Take 2.5 mLs (0.5 mg total) by nebulization 4 (four) times daily. 75 mL 12  . Multiple Vitamin (MULTIVITAMIN) tablet Take  1 tablet by mouth daily.    . predniSONE (DELTASONE) 10 MG tablet Take 1 tablet (10 mg total) by mouth daily with breakfast. 30 tablet 0  . sertraline (ZOLOFT) 100 MG tablet Take 1 tablet (100 mg total) by mouth daily. 90 tablet 1  . traMADol (ULTRAM) 50 MG tablet Take 1 tablet (50 mg total) by mouth every 6 (six) hours as needed. (Patient not taking: Reported on 11/29/2017) 20 tablet 0   Facility-Administered Medications Prior to Visit  Medication Dose Route Frequency Provider Last Rate Last Dose  . ipratropium-albuterol (DUONEB) 0.5-2.5 (3) MG/3ML nebulizer solution 3 mL  3 mL Nebulization Once Jones, Deanna C, MD      . ipratropium-albuterol (DUONEB) 0.5-2.5 (3) MG/3ML nebulizer solution 3 mL  3 mL Nebulization Q6H Yetta Barre, Deanna C, MD        Review of Systems  Constitutional: Positive for chills and diaphoresis. Negative for fever, malaise/fatigue and weight loss.  HENT: Positive for congestion, hoarse voice, postnasal drip, rhinorrhea, sinus pressure and sneezing. Negative for ear discharge, ear pain and sore throat.   Eyes: Negative for blurred vision.  Respiratory: Positive for cough, shortness of breath and wheezing. Negative for hemoptysis and sputum production.   Cardiovascular: Negative for chest pain, palpitations and leg swelling.  Gastrointestinal: Negative for abdominal pain, blood in stool, constipation, diarrhea, heartburn, melena and nausea.  Genitourinary: Negative for dysuria, frequency, hematuria and urgency.  Musculoskeletal: Negative for back pain, joint pain, myalgias and neck pain.  Skin: Negative for rash.  Neurological: Positive for headaches. Negative for dizziness, tingling, sensory change and focal weakness.  Endo/Heme/Allergies: Negative for environmental allergies and polydipsia. Does not bruise/bleed easily.  Psychiatric/Behavioral: Negative for depression and suicidal ideas. The patient is not nervous/anxious and does not have insomnia.       Objective  Vitals:   12/05/17 1426  BP: 130/90  Pulse: 64  SpO2: 96%  Weight: 279 lb (126.6 kg)  Height: 5\' 9"  (1.753 m)    Physical Exam  Constitutional: No distress.  HENT:  Head: Normocephalic and atraumatic.  Right Ear: External ear normal.  Left Ear: External ear normal.  Nose: Nose normal.  Mouth/Throat: Oropharynx is clear and moist.  Eyes: Pupils are equal, round, and reactive to light. Conjunctivae and EOM are normal. Right eye exhibits no discharge. Left eye exhibits no discharge.  Neck: Normal range of motion. Neck supple. No JVD present. No thyromegaly present.  Cardiovascular: Normal rate, regular rhythm, normal heart sounds and intact distal pulses. Exam reveals no gallop and no friction rub.  No murmur heard. Pulmonary/Chest: Effort normal. No stridor. No respiratory distress. She has no decreased breath sounds. She has wheezes. She has rhonchi. She has no rales.  She exhibits no tenderness.  Abdominal: Soft. Bowel sounds are normal. She exhibits no mass. There is no tenderness. There is no guarding.  Musculoskeletal: Normal range of motion. She exhibits no edema.  Lymphadenopathy:    She has no cervical adenopathy.  Neurological: She is alert. She has normal reflexes.  Skin: Skin is warm and dry. She is not diaphoretic.  Nursing note and vitals reviewed.     Assessment & Plan  Problem List Items Addressed This Visit    None    Visit Diagnoses    Mild intermittent asthma with acute exacerbation    -  Primary   Condition unchange will increase to taper prednisonr   Relevant Medications   predniSONE (DELTASONE) 10 MG tablet   Other Relevant Orders   DG Chest 2 View (Completed)   Bronchitis       Cough now productive and more frequent. will increase to prednisone taper and start levaquin 500 q day for 7 days. Will do xray to rule out pneumonia.   Relevant Medications   levofloxacin (LEVAQUIN) 500 MG tablet   predniSONE (DELTASONE) 10 MG tablet    benzonatate (TESSALON) 100 MG capsule      Meds ordered this encounter  Medications  . levofloxacin (LEVAQUIN) 500 MG tablet    Sig: Take 1 tablet (500 mg total) by mouth daily.    Dispense:  7 tablet    Refill:  0  . predniSONE (DELTASONE) 10 MG tablet    Sig: Taper 6,6,6,5,5,5,4,4,3,3,2,2,1,1    Dispense:  53 tablet    Refill:  1  . benzonatate (TESSALON) 100 MG capsule    Sig: Take 1 capsule (100 mg total) by mouth 2 (two) times daily as needed for cough.    Dispense:  20 capsule    Refill:  0      Dr. Elizabeth [facility]uereanna Jones [facility]  12/05/17

## 2017-12-06 MED ORDER — DEXAMETHASONE SODIUM PHOSPHATE 10 MG/ML IJ SOLN
10.0000 mg | Freq: Once | INTRAMUSCULAR | Status: AC
  Administered 2017-12-06: 10 mg via INTRAVENOUS
  Filled 2017-12-06: qty 1

## 2017-12-06 MED ORDER — ALBUTEROL (5 MG/ML) CONTINUOUS INHALATION SOLN
20.0000 mg/h | INHALATION_SOLUTION | Freq: Once | RESPIRATORY_TRACT | Status: AC
  Administered 2017-12-06: 20 mg/h via RESPIRATORY_TRACT
  Filled 2017-12-06: qty 20

## 2017-12-06 MED ORDER — HYDROCODONE-HOMATROPINE 5-1.5 MG/5ML PO SYRP: 5.0000 mL | ORAL_SOLUTION | Freq: Four times a day (QID) | ORAL | 0 refills | Status: DC | PRN

## 2017-12-06 MED ORDER — ALBUTEROL SULFATE HFA 108 (90 BASE) MCG/ACT IN AERS: 1.0000 | INHALATION_SPRAY | Freq: Four times a day (QID) | RESPIRATORY_TRACT | 0 refills | Status: DC | PRN

## 2017-12-06 MED ORDER — IPRATROPIUM-ALBUTEROL 0.5-2.5 (3) MG/3ML IN SOLN
3.0000 mL | Freq: Once | RESPIRATORY_TRACT | Status: AC
  Administered 2017-12-06: 3 mL via RESPIRATORY_TRACT
  Filled 2017-12-06: qty 3

## 2017-12-06 NOTE — Discharge Instructions (Signed)
Take prednisone as directed.  Use inhalers and nebulizers as directed.  Take cough syrup as directed.  Follow-up with your primary care doctor in the next 2 to 4 days for further evaluation.  Return the emergency department for any fevers, difficulty breathing, chest pain or any other worsening or concerning symptoms.

## 2017-12-06 NOTE — ED Notes (Signed)
Resting quietly on stretcher with eyes closed - no complaints at this time

## 2017-12-06 NOTE — ED Provider Notes (Signed)
[facility] EMERGENCY DEPARTMENT Provider Note   CSN: [id number] Arrival date & time: 12/05/17  2224     History   Chief Complaint Chief Complaint  Patient presents with  . Asthma    HPI Annette Perez is a 57 y.o. female.  HPI  This is a 57 year old female with a history of asthma who presents with persistent cough and wheezing.  Patient reports 2-week history of persistent cough and worsening wheezing.  This is been fairly refractory to her home DuoNeb's.  Patient states that she saw her primary physician last week.  She was started on prednisone and has been using her nebulizers at home.  She did not improve.  She was seen yesterday in the office and started on a prednisone taper.  She was told that she did not improve she should be reevaluated.  She did have a chest x-ray yesterday which was negative for pneumonia.  She reports chills without documented fevers.  Cough is now productive and was initially nonproductive.  Patient reports that she has never had asthma this bad before.  She reports symptoms came on after she cleaned a pool with a significant amount of bleach.  She thinks this may have triggered her difficulties.  She has never had to be hospitalized for her asthma.  She denies any chest pain.  She takes Advair daily for maintenance.  Past Medical History:  Diagnosis Date  . Asthma   . Depression     There are no active problems to display for this patient.   Past Surgical History:  Procedure Laterality Date  . BARIATRIC SURGERY     2011  . CHOLECYSTECTOMY    . HERNIA REPAIR    . HIATAL HERNIA REPAIR     1990's  . KNEE ARTHROSCOPY       OB History   None      Home Medications    Prior to Admission medications   Medication Sig Start Date End Date Taking? Authorizing Provider  albuterol (PROVENTIL) (2.5 MG/3ML) 0.083% nebulizer solution Take 3 mLs (2.5 mg total) by nebulization every 6 (six) hours as needed for wheezing or shortness  of breath. 07/25/17  Yes Duanne Limerick, MD  benzonatate (TESSALON) 100 MG capsule Take 1 capsule (100 mg total) by mouth 2 (two) times daily as needed for cough. 12/05/17  Yes Duanne Limerick, MD  Fluticasone-Salmeterol (ADVAIR DISKUS) 250-50 MCG/DOSE AEPB INHALE 1 PUFF INTO THE LUNGS TWICE A DAY 07/25/17  Yes Elizabeth Sauer C, MD  ipratropium (ATROVENT) 0.02 % nebulizer solution Take 2.5 mLs (0.5 mg total) by nebulization 4 (four) times daily. 11/29/17  Yes Duanne Limerick, MD  levofloxacin (LEVAQUIN) 500 MG tablet Take 1 tablet (500 mg total) by mouth daily. 12/05/17  Yes Duanne Limerick, MD  Multiple Vitamin (MULTIVITAMIN) tablet Take 1 tablet by mouth daily.   Yes [provider]  predniSONE (DELTASONE) 10 MG tablet Taper 6,6,6,5,5,5,4,4,3,3,2,2,1,1 Patient taking differently: Take 10-60 mg by mouth See admin instructions. Take 6 tablets for 3 days, take 5 tablets for 3 days, take 4 tablets for 2 days, take 3 tablets for 2 days, take 1 tablet for 2 days then stop 12/05/17  Yes Duanne Limerick, MD  sertraline (ZOLOFT) 100 MG tablet Take 1 tablet (100 mg total) by mouth daily. 07/25/17  Yes Duanne Limerick, MD  guaiFENesin-codeine (ROBITUSSIN AC) 100-10 MG/5ML syrup Take 5 mLs by mouth 3 (three) times daily as needed for cough. Patient not  taking: Reported on 12/06/2017 11/29/17   Duanne Limerick, MD  predniSONE (DELTASONE) 10 MG tablet Take 1 tablet (10 mg total) by mouth daily with breakfast. Patient not taking: Reported on 12/06/2017 11/29/17   Duanne Limerick, MD  traMADol (ULTRAM) 50 MG tablet Take 1 tablet (50 mg total) by mouth every 6 (six) hours as needed. Patient not taking: Reported on 11/29/2017 10/18/17   Duanne Limerick, MD    Family History Family History  Adopted: Yes    Social History Social History   Tobacco Use  . Smoking status: Former Games developer  . Smokeless tobacco: Never Used  Substance Use Topics  . Alcohol use: Yes    Comment: socially  . Drug use: No     Allergies     Ibuprofen   Review of Systems Review of Systems  Constitutional: Negative for fever.  Respiratory: Positive for cough, shortness of breath and wheezing.   Cardiovascular: Negative for chest pain.  Gastrointestinal: Negative for abdominal pain, nausea and vomiting.  Genitourinary: Negative for dysuria.  Neurological: Negative for headaches.  All other systems reviewed and are negative.    Physical Exam Updated Vital Signs BP (!) 146/88   Pulse 67   Temp 97.7 F (36.5 C) (Oral)   Resp 15   SpO2 97%   Physical Exam  Constitutional: She is oriented to person, place, and time. She appears well-developed and well-nourished. No distress.  HENT:  Head: Normocephalic and atraumatic.  Eyes: Pupils are equal, round, and reactive to light.  Cardiovascular: Normal rate, regular rhythm and normal heart sounds.  Pulmonary/Chest: Effort normal. No respiratory distress. She has wheezes.  Mild tachypnea, wheezing in all lung fields with fair air movement  Abdominal: Soft. Bowel sounds are normal. There is no tenderness.  Musculoskeletal: She exhibits no edema.  Neurological: She is alert and oriented to person, place, and time.  Skin: Skin is warm and dry.  Psychiatric: She has a normal mood and affect.  Nursing note and vitals reviewed.    ED Treatments / Results  Labs (all labs ordered are listed, but only abnormal results are displayed) Labs Reviewed  CBC  BASIC METABOLIC PANEL    EKG EKG Interpretation  Date/Time:  Monday December 05 2017 22:52:37 EDT Ventricular Rate:  63 PR Interval:  140 QRS Duration: 108 QT Interval:  414 QTC Calculation: 423 R Axis:   24 Text Interpretation:  Normal sinus rhythm Septal infarct , age undetermined Abnormal ECG No prior for comparison Confirmed by Ross Marcus ([id number]) on 12/06/2017 4:20:26 AM   Radiology Dg Chest 2 View  Result Date: 12/05/2017 CLINICAL DATA:  Cough and congestion EXAM: CHEST - 2 VIEW COMPARISON:  None. FINDINGS:  There is mild scarring in the left base region with mild elevation of the left hemidiaphragm. No edema or consolidation. The heart size and pulmonary vascularity are normal. No adenopathy. There is aortic atherosclerosis. There is mild degenerative change in thoracic spine. IMPRESSION: Mild scarring left base. No edema or consolidation. Heart size normal. There is aortic atherosclerosis. Aortic Atherosclerosis (ICD10-I70.0). Electronically Signed   By: Bretta Bang III M.D.   On: 12/05/2017 16:21    Procedures Procedures (including critical care time)  CRITICAL CARE Performed by: Shon Baton   Total critical care time: 30 minutes  Critical care time was exclusive of separately billable procedures and treating other patients.  Critical care was necessary to treat or prevent imminent or life-threatening deterioration.  Critical care was time spent personally by  me on the following activities: development of treatment plan with patient and/or surrogate as well as nursing, discussions with consultants, evaluation of patient's response to treatment, examination of patient, obtaining history from patient or surrogate, ordering and performing treatments and interventions, ordering and review of laboratory studies, ordering and review of radiographic studies, pulse oximetry and re-evaluation of patient's condition.   Medications Ordered in ED Medications  ipratropium-albuterol (DUONEB) 0.5-2.5 (3) MG/3ML nebulizer solution 3 mL (has no administration in time range)  albuterol (PROVENTIL) (2.5 MG/3ML) 0.083% nebulizer solution 5 mg (5 mg Nebulization Given 12/05/17 2245)  albuterol (PROVENTIL,VENTOLIN) solution continuous neb (20 mg/hr Nebulization Given 12/06/17 0507)  dexamethasone (DECADRON) injection 10 mg (10 mg Intravenous Given 12/06/17 0553)     Initial Impression / Assessment and Plan / ED Course  I have reviewed the triage vital signs and the nursing notes.  Pertinent labs &  imaging results that were available during my care of the patient were reviewed by me and considered in my medical decision making (see chart for details).  Clinical Course as of Dec 07 723  Tue Dec 06, 2017  0632 Patient with better air movement after continuous DuoNeb.  She does continue to have some wheezing.  Will monitor.  Patient would like to try to go home if possible.  Repeat evaluation and DuoNeb off respiratory therapy ordered for 1 hour.   [CH]    Clinical Course User Index [CH] Chiara Coltrin, Mayer Maskerourtney F, MD   Patient presents with persistent cough and wheezing.  Refractory to appropriate steroids at home as well as DuoNeb's.  She is nontoxic and in no respiratory distress.  O2 sats 97%.  She does have wheezing on exam.  She was started on continuous nebulization.  Chest x-ray reviewed and shows no evidence of pneumonia.  See clinical course above.  Patient would like to go home.  She will need recheck 1 hour and repeat neb in 1 hour.  Patient signed out to Graciella FreerLindsey Layden, [place].  Final Clinical Impressions(s) / ED Diagnoses   Final diagnoses:  Moderate persistent asthma with exacerbation    ED Discharge Orders    None       Syerra Abdelrahman, Mayer Maskerourtney F, MD 12/06/17 [phone]

## 2017-12-06 NOTE — ED Provider Notes (Signed)
Care assumed from  Dr. Wilkie AyeHorton at shift change with repeat neb and re-evaluation pending.   In brief, this patient is a 57 y.o. F with PMH/o Asthma who presents for evaluation of SOB and cough associated with asthma exacerbation. Patient was recently cleaning her pool with some bleach which was thought to exacerbate her asthma. Seen by PCP for evaluation of symptoms and started on prednisone which she completed. Returned to PCP yesterday. Started on prednisone taper. CXR yesterday showed no evidence of pneumonia.   PLAN: Finished CAT with improvement. Still some residual wheezing. Plan for repeat duoneb and re-eval.   7:55 AM: Re-evaluation.  Patient resting comfortably on bed.  Good air movement noted.  Faint wheezing noted.  Patient reports feeling good.  Pending repeat DuoNeb.  9:42 AM: Reevaluation after DuoNeb.  Good air movement noted.  Wheezing overall improved.  Still some intermittent coughing.  Patient reports feeling better.  We will plan to ambulate with pulse ox.  Patient able ambulated around the department with O2 sats are greater than 95% on room air.  Patient reports that she had no difficulty with ambulating.  Patient reports that she feels better.  Increased work of breathing has improved.  She is able to speak in full sentences without any difficulty.  Reevaluation shows improvement.  She still has some faint rhonchi noted.  Good air movement.  Patient vital signs are stable.  Engaged in shared decision-making with patient.  Offered admission to patient for continual nebulizers and breathing treatments.  Though patient does appear stable.  We discussed risk first benefits of admission versus discharge home.  Patient would rather be discharged home.  At this time, her O2 sats are greater than 95% on room air.  Her wheezing has improved.  She reports improvement in symptoms.  At this time, patient stable for discharge.  We will plan to send her home with cough medication to help with the  cough.  Patient instructed to return to emergency department for any worsening or concerning symptoms. Patient had ample opportunity for questions and discussion. All patient's questions were answered with full understanding. Strict return precautions discussed. Patient expresses understanding and agreement to plan.    1. Moderate persistent asthma with exacerbation   2. Mild intermittent reactive airway disease with acute exacerbation        Maxwell CaulLayden, Laelani Vasko A, PA-C 12/06/17 1545    Horton, Mayer Maskerourtney F, MD 12/07/17 [phone]

## 2017-12-06 NOTE — ED Notes (Signed)
Walked patient around nursing station patient oxygen level stayed at 95 heart rate at 109 when patient started talking oxygen level went to 98 room air

## 2017-12-06 NOTE — ED Notes (Signed)
Care assumed at this time; resting quietly on stretcher with eyes closed - easily arousable; no complaints at this time - states breathing is "better"

## 2017-12-07 ENCOUNTER — Ambulatory Visit: Payer: BC Managed Care – PPO | Admitting: Family Medicine

## 2017-12-07 ENCOUNTER — Encounter: Payer: Self-pay | Admitting: Family Medicine

## 2017-12-07 VITALS — BP 130/70 | HR 60 | Ht 69.0 in | Wt 283.0 lb

## 2017-12-07 DIAGNOSIS — J4521 Mild intermittent asthma with (acute) exacerbation: Secondary | ICD-10-CM | POA: Diagnosis not present

## 2017-12-07 NOTE — Progress Notes (Signed)
Name: Annette Perez   MRN: [id number]    DOB: 1961-03-19   Date:12/07/2017       Progress Note  Subjective  Chief Complaint  Chief Complaint  Patient presents with  . Follow-up    asthma    Follow up asthma flare after exposure to chlorine.   Asthma  She complains of hoarse voice and wheezing. There is no chest tightness, cough, difficulty breathing, frequent throat clearing, hemoptysis, shortness of breath or sputum production. Primary symptoms comments: All aspects improved. The current episode started 1 to 4 weeks ago. The problem occurs intermittently. The problem has been gradually improving. Associated symptoms include dyspnea on exertion and orthopnea. Pertinent negatives include no appetite change, chest pain, ear congestion, ear pain, fever, headaches, heartburn, malaise/fatigue, myalgias, nasal congestion, PND, rhinorrhea, sore throat, trouble swallowing or weight loss. Her symptoms are aggravated by change in weather (moderate activity). Her symptoms are alleviated by beta-agonist, ipratropium, prescription cough suppressant and oral steroids. Risk factors: see above. Her past medical history is significant for asthma and bronchitis.    No problem-specific Assessment & Plan notes found for this encounter.   Past Medical History:  Diagnosis Date  . Asthma   . Depression     Past Surgical History:  Procedure Laterality Date  . BARIATRIC SURGERY     2011  . CHOLECYSTECTOMY    . HERNIA REPAIR    . HIATAL HERNIA REPAIR     1990's  . KNEE ARTHROSCOPY      Family History  Adopted: Yes    Social History   Socioeconomic History  . Marital status: Single    Spouse name: Not on file  . Number of children: Not on file  . Years of education: Not on file  . Highest education level: Not on file  Occupational History  . Not on file  Social Needs  . Financial resource strain: Not on file  . Food insecurity:    Worry: Not on file    Inability: Not on file  .  Transportation needs:    Medical: Not on file    Non-medical: Not on file  Tobacco Use  . Smoking status: Former Games developer  . Smokeless tobacco: Never Used  Substance and Sexual Activity  . Alcohol use: Yes    Comment: socially  . Drug use: No  . Sexual activity: Not on file  Lifestyle  . Physical activity:    Days per week: Not on file    Minutes per session: Not on file  . Stress: Not on file  Relationships  . Social connections:    Talks on phone: Not on file    Gets together: Not on file    Attends religious service: Not on file    Active member of club or organization: Not on file    Attends meetings of clubs or organizations: Not on file    Relationship status: Not on file  . Intimate partner violence:    Fear of current or ex partner: Not on file    Emotionally abused: Not on file    Physically abused: Not on file    Forced sexual activity: Not on file  Other Topics Concern  . Not on file  Social History Narrative  . Not on file    Allergies  Allergen Reactions  . Ibuprofen Other (See Comments)    weightloss surgery    Outpatient Medications Prior to Visit  Medication Sig Dispense Refill  . albuterol (PROVENTIL HFA;VENTOLIN HFA) 108 (  90 Base) MCG/ACT inhaler Inhale 1-2 puffs into the lungs every 6 (six) hours as needed for wheezing or shortness of breath. 1 Inhaler 0  . albuterol (PROVENTIL) (2.5 MG/3ML) 0.083% nebulizer solution Take 3 mLs (2.5 mg total) by nebulization every 6 (six) hours as needed for wheezing or shortness of breath. 150 mL 1  . benzonatate (TESSALON) 100 MG capsule Take 1 capsule (100 mg total) by mouth 2 (two) times daily as needed for cough. 20 capsule 0  . Fluticasone-Salmeterol (ADVAIR DISKUS) 250-50 MCG/DOSE AEPB INHALE 1 PUFF INTO THE LUNGS TWICE A DAY 60 each 11  . HYDROcodone-homatropine (HYCODAN) 5-1.5 MG/5ML syrup Take 5 mLs by mouth every 6 (six) hours as needed for cough. 120 mL 0  . ipratropium (ATROVENT) 0.02 % nebulizer solution  Take 2.5 mLs (0.5 mg total) by nebulization 4 (four) times daily. 75 mL 12  . levofloxacin (LEVAQUIN) 500 MG tablet Take 1 tablet (500 mg total) by mouth daily. 7 tablet 0  . Multiple Vitamin (MULTIVITAMIN) tablet Take 1 tablet by mouth daily.    . predniSONE (DELTASONE) 10 MG tablet Taper 6,6,6,5,5,5,4,4,3,3,2,2,1,1 (Patient taking differently: Take 10-60 mg by mouth See admin instructions. Take 6 tablets for 3 days, take 5 tablets for 3 days, take 4 tablets for 2 days, take 3 tablets for 2 days, take 1 tablet for 2 days then stop) 53 tablet 1  . sertraline (ZOLOFT) 100 MG tablet Take 1 tablet (100 mg total) by mouth daily. 90 tablet 1  . traMADol (ULTRAM) 50 MG tablet Take 1 tablet (50 mg total) by mouth every 6 (six) hours as needed. 20 tablet 0  . guaiFENesin-codeine (ROBITUSSIN AC) 100-10 MG/5ML syrup Take 5 mLs by mouth 3 (three) times daily as needed for cough. (Patient not taking: Reported on 12/07/2017) 150 mL 0  . predniSONE (DELTASONE) 10 MG tablet Take 1 tablet (10 mg total) by mouth daily with breakfast. (Patient not taking: Reported on 12/06/2017) 30 tablet 0   Facility-Administered Medications Prior to Visit  Medication Dose Route Frequency Provider Last Rate Last Dose  . ipratropium-albuterol (DUONEB) 0.5-2.5 (3) MG/3ML nebulizer solution 3 mL  3 mL Nebulization Once Duanne Limerick, MD        Review of Systems  Constitutional: Negative for appetite change, chills, fever, malaise/fatigue and weight loss.  HENT: Positive for hoarse voice. Negative for ear discharge, ear pain, rhinorrhea, sore throat and trouble swallowing.   Eyes: Negative for blurred vision.  Respiratory: Positive for wheezing. Negative for cough, hemoptysis, sputum production and shortness of breath.   Cardiovascular: Positive for dyspnea on exertion. Negative for chest pain, palpitations, leg swelling and PND.  Gastrointestinal: Negative for abdominal pain, blood in stool, constipation, diarrhea, heartburn, melena  and nausea.  Genitourinary: Negative for dysuria, frequency, hematuria and urgency.  Musculoskeletal: Negative for back pain, joint pain, myalgias and neck pain.  Skin: Negative for rash.  Neurological: Negative for dizziness, tingling, sensory change, focal weakness and headaches.  Endo/Heme/Allergies: Negative for environmental allergies and polydipsia. Does not bruise/bleed easily.  Psychiatric/Behavioral: Negative for depression and suicidal ideas. The patient is not nervous/anxious and does not have insomnia.      Objective  Vitals:   12/07/17 0804  BP: 130/70  Pulse: 60  SpO2: 98%  Weight: 283 lb (128.4 kg)  Height: 5\' 9"  (1.753 m)    Physical Exam  Constitutional: No distress.  HENT:  Head: Normocephalic and atraumatic.  Right Ear: External ear normal.  Left Ear: External ear normal.  Nose: Nose normal.  Mouth/Throat: Oropharynx is clear and moist.  Eyes: Pupils are equal, round, and reactive to light. Conjunctivae and EOM are normal. Right eye exhibits no discharge. Left eye exhibits no discharge.  Neck: Normal range of motion. Neck supple. No JVD present. No thyromegaly present.  Cardiovascular: Normal rate, regular rhythm, normal heart sounds and intact distal pulses. Exam reveals no gallop and no friction rub.  No murmur heard. Pulmonary/Chest: Effort normal. She has wheezes.  Abdominal: Soft. Bowel sounds are normal. She exhibits no mass. There is no tenderness. There is no guarding.  Musculoskeletal: Normal range of motion. She exhibits no edema.  Lymphadenopathy:    She has no cervical adenopathy.  Neurological: She is alert. She has normal reflexes.  Skin: Skin is warm and dry. She is not diaphoretic.  Nursing note and vitals reviewed.     Assessment & Plan  Problem List Items Addressed This Visit    None    Visit Diagnoses    Mild intermittent asthma with acute exacerbation    -  Primary   Improving. Will continue Levaquin/prednisone/ and inhalers       No orders of the defined types were placed in this encounter.     Dr. Hayden Rasmusseneanna Jones [facility]  12/07/17

## 2017-12-08 ENCOUNTER — Ambulatory Visit: Payer: BC Managed Care – PPO | Admitting: Family Medicine

## 2017-12-12 ENCOUNTER — Ambulatory Visit: Payer: BC Managed Care – PPO | Admitting: Family Medicine

## 2017-12-12 ENCOUNTER — Encounter: Payer: Self-pay | Admitting: Family Medicine

## 2017-12-12 VITALS — BP 118/80 | HR 72 | Ht 69.0 in | Wt 283.0 lb

## 2017-12-12 DIAGNOSIS — J4521 Mild intermittent asthma with (acute) exacerbation: Secondary | ICD-10-CM | POA: Diagnosis not present

## 2017-12-12 NOTE — Progress Notes (Signed)
Name: Annette Perez   MRN: [id number]    DOB: 02-19-1961   Date:12/12/2017       Progress Note  Subjective  Chief Complaint  Chief Complaint  Patient presents with  . Follow-up    asthma flare up    Asthma  She complains of frequent throat clearing and sputum production. There is no chest tightness, cough, difficulty breathing, hemoptysis, hoarse voice, shortness of breath or wheezing. This is a recurrent problem. The current episode started in the past 7 days. The problem occurs intermittently. The problem has been gradually improving. Pertinent negatives include no appetite change, chest pain, dyspnea on exertion, ear congestion, ear pain, fever, headaches, heartburn, malaise/fatigue, myalgias, nasal congestion, orthopnea, PND, postnasal drip, rhinorrhea, sneezing, sore throat, sweats, trouble swallowing or weight loss. Her symptoms are alleviated by oral steroids, beta-agonist and steroid inhaler. She reports significant improvement on treatment. Her past medical history is significant for asthma. There is no history of bronchiectasis, bronchitis, COPD, emphysema or pneumonia.    No problem-specific Assessment & Plan notes found for this encounter.   Past Medical History:  Diagnosis Date  . Asthma   . Depression     Past Surgical History:  Procedure Laterality Date  . BARIATRIC SURGERY     2011  . CHOLECYSTECTOMY    . HERNIA REPAIR    . HIATAL HERNIA REPAIR     1990's  . KNEE ARTHROSCOPY      Family History  Adopted: Yes    Social History   Socioeconomic History  . Marital status: Single    Spouse name: Not on file  . Number of children: Not on file  . Years of education: Not on file  . Highest education level: Not on file  Occupational History  . Not on file  Social Needs  . Financial resource strain: Not on file  . Food insecurity:    Worry: Not on file    Inability: Not on file  . Transportation needs:    Medical: Not on file    Non-medical: Not on file   Tobacco Use  . Smoking status: Former Games developer  . Smokeless tobacco: Never Used  Substance and Sexual Activity  . Alcohol use: Yes    Comment: socially  . Drug use: No  . Sexual activity: Not on file  Lifestyle  . Physical activity:    Days per week: Not on file    Minutes per session: Not on file  . Stress: Not on file  Relationships  . Social connections:    Talks on phone: Not on file    Gets together: Not on file    Attends religious service: Not on file    Active member of club or organization: Not on file    Attends meetings of clubs or organizations: Not on file    Relationship status: Not on file  . Intimate partner violence:    Fear of current or ex partner: Not on file    Emotionally abused: Not on file    Physically abused: Not on file    Forced sexual activity: Not on file  Other Topics Concern  . Not on file  Social History Narrative  . Not on file    Allergies  Allergen Reactions  . Ibuprofen Other (See Comments)    weightloss surgery    Outpatient Medications Prior to Visit  Medication Sig Dispense Refill  . albuterol (PROVENTIL HFA;VENTOLIN HFA) 108 (90 Base) MCG/ACT inhaler Inhale 1-2 puffs into the lungs  every 6 (six) hours as needed for wheezing or shortness of breath. 1 Inhaler 0  . albuterol (PROVENTIL) (2.5 MG/3ML) 0.083% nebulizer solution Take 3 mLs (2.5 mg total) by nebulization every 6 (six) hours as needed for wheezing or shortness of breath. 150 mL 1  . Fluticasone-Salmeterol (ADVAIR DISKUS) 250-50 MCG/DOSE AEPB INHALE 1 PUFF INTO THE LUNGS TWICE A DAY 60 each 11  . guaiFENesin-codeine (ROBITUSSIN AC) 100-10 MG/5ML syrup Take 5 mLs by mouth 3 (three) times daily as needed for cough. 150 mL 0  . ipratropium (ATROVENT) 0.02 % nebulizer solution Take 2.5 mLs (0.5 mg total) by nebulization 4 (four) times daily. 75 mL 12  . Multiple Vitamin (MULTIVITAMIN) tablet Take 1 tablet by mouth daily.    . predniSONE (DELTASONE) 10 MG tablet Taper  6,6,6,5,5,5,4,4,3,3,2,2,1,1 (Patient taking differently: Take 10-60 mg by mouth See admin instructions. Take 6 tablets for 3 days, take 5 tablets for 3 days, take 4 tablets for 2 days, take 3 tablets for 2 days, take 1 tablet for 2 days then stop) 53 tablet 1  . sertraline (ZOLOFT) 100 MG tablet Take 1 tablet (100 mg total) by mouth daily. 90 tablet 1  . predniSONE (DELTASONE) 10 MG tablet Take 1 tablet (10 mg total) by mouth daily with breakfast. 30 tablet 0  . benzonatate (TESSALON) 100 MG capsule Take 1 capsule (100 mg total) by mouth 2 (two) times daily as needed for cough. (Patient not taking: Reported on 12/12/2017) 20 capsule 0  . HYDROcodone-homatropine (HYCODAN) 5-1.5 MG/5ML syrup Take 5 mLs by mouth every 6 (six) hours as needed for cough. (Patient not taking: Reported on 12/12/2017) 120 mL 0  . traMADol (ULTRAM) 50 MG tablet Take 1 tablet (50 mg total) by mouth every 6 (six) hours as needed. (Patient not taking: Reported on 12/12/2017) 20 tablet 0  . levofloxacin (LEVAQUIN) 500 MG tablet Take 1 tablet (500 mg total) by mouth daily. 7 tablet 0   Facility-Administered Medications Prior to Visit  Medication Dose Route Frequency Provider Last Rate Last Dose  . ipratropium-albuterol (DUONEB) 0.5-2.5 (3) MG/3ML nebulizer solution 3 mL  3 mL Nebulization Once Duanne LimerickJones, Deanna C, MD        Review of Systems  Constitutional: Negative for appetite change, chills, fever, malaise/fatigue and weight loss.  HENT: Negative for ear discharge, ear pain, hoarse voice, postnasal drip, rhinorrhea, sneezing, sore throat and trouble swallowing.   Eyes: Negative for blurred vision.  Respiratory: Positive for sputum production. Negative for cough, hemoptysis, shortness of breath and wheezing.   Cardiovascular: Negative for chest pain, dyspnea on exertion, palpitations, leg swelling and PND.  Gastrointestinal: Negative for abdominal pain, blood in stool, constipation, diarrhea, heartburn, melena and nausea.   Genitourinary: Negative for dysuria, frequency, hematuria and urgency.  Musculoskeletal: Negative for back pain, joint pain, myalgias and neck pain.  Skin: Negative for rash.  Neurological: Negative for dizziness, tingling, sensory change, focal weakness and headaches.  Endo/Heme/Allergies: Negative for environmental allergies and polydipsia. Does not bruise/bleed easily.  Psychiatric/Behavioral: Negative for depression and suicidal ideas. The patient is not nervous/anxious and does not have insomnia.      Objective  Vitals:   12/12/17 1336  BP: 118/80  Pulse: 72  SpO2: 98%  Weight: 283 lb (128.4 kg)  Height: 5\' 9"  (1.753 m)    Physical Exam  Constitutional: No distress.  HENT:  Head: Normocephalic and atraumatic.  Right Ear: External ear normal.  Left Ear: External ear normal.  Nose: Nose normal.  Mouth/Throat:  Oropharynx is clear and moist.  Eyes: Pupils are equal, round, and reactive to light. Conjunctivae and EOM are normal. Right eye exhibits no discharge. Left eye exhibits no discharge.  Neck: Normal range of motion. Neck supple. No JVD present. No thyromegaly present.  Cardiovascular: Normal rate, regular rhythm, normal heart sounds and intact distal pulses. Exam reveals no gallop and no friction rub.  No murmur heard. Pulmonary/Chest: Effort normal and breath sounds normal. No stridor. No respiratory distress. She has no wheezes. She has no rales. She exhibits no tenderness.  Abdominal: Soft. Bowel sounds are normal. She exhibits no mass. There is no tenderness. There is no guarding.  Musculoskeletal: Normal range of motion. She exhibits no edema.  Lymphadenopathy:    She has no cervical adenopathy.  Neurological: She is alert. She has normal reflexes.  Skin: Skin is warm and dry. She is not diaphoretic.  Nursing note and vitals reviewed.     Assessment & Plan  Problem List Items Addressed This Visit    None    Visit Diagnoses    Mild intermittent asthma  with acute exacerbation    -  Primary   Continue current regimen prednisone/albuterol inhaler improving      No orders of the defined types were placed in this encounter.     Dr. [facility]  12/12/17

## 2017-12-26 ENCOUNTER — Other Ambulatory Visit: Payer: Self-pay | Admitting: Family Medicine

## 2017-12-26 DIAGNOSIS — J4521 Mild intermittent asthma with (acute) exacerbation: Secondary | ICD-10-CM

## 2018-03-01 ENCOUNTER — Other Ambulatory Visit: Payer: Self-pay | Admitting: Family Medicine

## 2018-03-01 DIAGNOSIS — F329 Major depressive disorder, single episode, unspecified: Secondary | ICD-10-CM

## 2018-05-01 ENCOUNTER — Ambulatory Visit: Payer: BC Managed Care – PPO | Admitting: Family Medicine

## 2018-05-01 ENCOUNTER — Encounter: Payer: Self-pay | Admitting: Family Medicine

## 2018-05-01 VITALS — BP 128/70 | HR 60 | Ht 69.0 in | Wt 273.0 lb

## 2018-05-01 DIAGNOSIS — J40 Bronchitis, not specified as acute or chronic: Secondary | ICD-10-CM

## 2018-05-01 DIAGNOSIS — J0100 Acute maxillary sinusitis, unspecified: Secondary | ICD-10-CM | POA: Diagnosis not present

## 2018-05-01 DIAGNOSIS — J4521 Mild intermittent asthma with (acute) exacerbation: Secondary | ICD-10-CM

## 2018-05-01 DIAGNOSIS — J219 Acute bronchiolitis, unspecified: Secondary | ICD-10-CM | POA: Diagnosis not present

## 2018-05-01 MED ORDER — MONTELUKAST SODIUM 10 MG PO TABS: 10.0000 mg | ORAL_TABLET | Freq: Every day | ORAL | 3 refills | Status: DC

## 2018-05-01 MED ORDER — GUAIFENESIN-CODEINE 100-10 MG/5ML PO SYRP: 5.0000 mL | ORAL_SOLUTION | Freq: Three times a day (TID) | ORAL | 0 refills | Status: DC | PRN

## 2018-05-01 MED ORDER — AMOXICILLIN-POT CLAVULANATE 875-125 MG PO TABS
1.0000 | ORAL_TABLET | Freq: Two times a day (BID) | ORAL | 0 refills | Status: DC
Start: 2018-05-01 — End: 2018-06-12

## 2018-05-01 MED ORDER — PREDNISONE 10 MG PO TABS: ORAL_TABLET | ORAL | 1 refills | Status: DC

## 2018-05-01 MED ORDER — BENZONATATE 100 MG PO CAPS: 100.0000 mg | ORAL_CAPSULE | Freq: Two times a day (BID) | ORAL | 0 refills | Status: DC | PRN

## 2018-05-01 NOTE — Progress Notes (Addendum)
Date:  05/01/2018   Name:  Annette Perez   DOB:  14-Jul-1960   MRN:  [id number]   Chief Complaint: Sinusitis (cough) Sinusitis  This is a new problem. The current episode started more than 1 month ago (6 weeks). The problem is unchanged. There has been no fever. Her pain is at a severity of 4/10. The pain is moderate. Associated symptoms include congestion, coughing, ear pain, headaches, a hoarse voice, neck pain, shortness of breath, sinus pressure, sneezing and swollen glands. Pertinent negatives include no chills, diaphoresis or sore throat. Past treatments include acetaminophen and oral decongestants. The treatment provided no relief.  Cough  This is a new problem. The current episode started more than 1 month ago. The problem has been waxing and waning. The cough is productive of sputum. Associated symptoms include ear pain, headaches, nasal congestion, postnasal drip, shortness of breath and wheezing. Pertinent negatives include no chills, eye redness, fever, myalgias, rash, rhinorrhea or sore throat. There is no history of environmental allergies.     Review of Systems  Constitutional: Negative.  Negative for chills, diaphoresis, fatigue, fever and unexpected weight change.  HENT: Positive for congestion, ear pain, hoarse voice, postnasal drip, sinus pressure and sneezing. Negative for ear discharge, rhinorrhea and sore throat.   Eyes: Negative for photophobia, pain, discharge, redness and itching.  Respiratory: Positive for cough, shortness of breath and wheezing. Negative for stridor.   Gastrointestinal: Negative for abdominal pain, blood in stool, constipation, diarrhea, nausea and vomiting.  Endocrine: Negative for cold intolerance, heat intolerance, polydipsia, polyphagia and polyuria.  Genitourinary: Negative for dysuria, flank pain, frequency, hematuria, menstrual problem, pelvic pain, urgency, vaginal bleeding and vaginal discharge.  Musculoskeletal: Positive for neck pain.  Negative for arthralgias, back pain and myalgias.  Skin: Negative for rash.  Allergic/Immunologic: Negative for environmental allergies and food allergies.  Neurological: Positive for headaches. Negative for dizziness, weakness, light-headedness and numbness.  Hematological: Negative for adenopathy. Does not bruise/bleed easily.  Psychiatric/Behavioral: Negative for dysphoric mood. The patient is not nervous/anxious.     There are no active problems to display for this patient.   Allergies  Allergen Reactions  . Ibuprofen Other (See Comments)    weightloss surgery    Past Surgical History:  Procedure Laterality Date  . BARIATRIC SURGERY     2011  . CHOLECYSTECTOMY    . HERNIA REPAIR    . HIATAL HERNIA REPAIR     1990's  . KNEE ARTHROSCOPY      Social History   Tobacco Use  . Smoking status: Former Games developer  . Smokeless tobacco: Never Used  Substance Use Topics  . Alcohol use: Yes    Comment: socially  . Drug use: No     Medication list has been reviewed and updated.  Current Meds  Medication Sig  . albuterol (PROVENTIL HFA;VENTOLIN HFA) 108 (90 Base) MCG/ACT inhaler Inhale 1-2 puffs into the lungs every 6 (six) hours as needed for wheezing or shortness of breath.  Marland Kitchen albuterol (PROVENTIL) (2.5 MG/3ML) 0.083% nebulizer solution Take 3 mLs (2.5 mg total) by nebulization every 6 (six) hours as needed for wheezing or shortness of breath.  . Fluticasone-Salmeterol (ADVAIR DISKUS) 250-50 MCG/DOSE AEPB INHALE 1 PUFF INTO THE LUNGS TWICE A DAY  . ipratropium (ATROVENT) 0.02 % nebulizer solution Take 2.5 mLs (0.5 mg total) by nebulization 4 (four) times daily.  . Multiple Vitamin (MULTIVITAMIN) tablet Take 1 tablet by mouth daily.  . sertraline (ZOLOFT) 100 MG tablet TAKE 1  TABLET BY MOUTH EVERY DAY   Current Facility-Administered Medications for the 05/01/18 encounter (Office Visit) with Duanne Limerick, MD  Medication  . ipratropium-albuterol (DUONEB) 0.5-2.5 (3) MG/3ML  nebulizer solution 3 mL    PHQ 2/9 Scores 05/01/2018 12/07/2017 12/05/2017 11/25/2015  PHQ - 2 Score 0 0 0 0  PHQ- 9 Score - 0 1 -    Physical Exam  Constitutional: She is oriented to person, place, and time. She appears well-developed and well-nourished. No distress.  HENT:  Head: Normocephalic and atraumatic.  Right Ear: Hearing, tympanic membrane, external ear and ear canal normal.  Left Ear: Hearing, tympanic membrane, external ear and ear canal normal.  Nose: No mucosal edema. Right sinus exhibits maxillary sinus tenderness. Left sinus exhibits maxillary sinus tenderness.  Mouth/Throat: Uvula is midline and oropharynx is clear and moist.  Eyes: Pupils are equal, round, and reactive to light. Conjunctivae and EOM are normal. Lids are everted and swept, no foreign bodies found. Right eye exhibits no discharge. Left eye exhibits no discharge and no hordeolum. No foreign body present in the left eye. Right conjunctiva is not injected. Left conjunctiva is not injected. No scleral icterus.  Neck: Normal range of motion. Neck supple. No JVD present. No tracheal deviation present. No thyromegaly present.  Cardiovascular: Normal rate, regular rhythm, S1 normal, S2 normal, normal heart sounds and intact distal pulses. PMI is not displaced. Exam reveals no gallop, no S3, no distant heart sounds and no friction rub.  No murmur heard.  No systolic murmur is present.  No diastolic murmur is present. Pulses:      Carotid pulses are 2+ on the right side, and 2+ on the left side.      Radial pulses are 2+ on the right side, and 2+ on the left side.       Femoral pulses are 2+ on the right side, and 2+ on the left side.      Popliteal pulses are 2+ on the right side, and 2+ on the left side.       Dorsalis pedis pulses are 2+ on the right side, and 2+ on the left side.       Posterior tibial pulses are 2+ on the right side, and 2+ on the left side.  Pulmonary/Chest: Effort normal and breath sounds normal.  No respiratory distress. She has no decreased breath sounds. She has no wheezes. She has no rhonchi. She has no rales.  Abdominal: Soft. Bowel sounds are normal. She exhibits no mass. There is no hepatosplenomegaly. There is no tenderness. There is no rebound and no guarding.  Musculoskeletal: Normal range of motion. She exhibits no edema or tenderness.  Lymphadenopathy:       Head (right side): No submandibular adenopathy present.       Head (left side): No submandibular adenopathy present.    She has no cervical adenopathy.  Neurological: She is alert and oriented to person, place, and time. She has normal strength and normal reflexes. She displays normal reflexes. No cranial nerve deficit.  Skin: Skin is warm and dry. No rash noted. She is not diaphoretic.  Psychiatric: She has a normal mood and affect. Her mood appears not anxious. She does not exhibit a depressed mood.  Nursing note and vitals reviewed.   BP 128/70   Pulse 60   Ht 5\' 9"  (1.753 m)   Wt 273 lb (123.8 kg)   SpO2 97%   BMI 40.32 kg/m   Assessment and Plan:  1.  Bronchitis Persistent Allergy component likely. Prescribe prednisone taper with tessalon perles.  - predniSONE (DELTASONE) 10 MG tablet; Taper 4,4,4,3,3,3,2,2,2,1,1,1  Dispense: 53 tablet; Refill: 1 - benzonatate (TESSALON) 100 MG capsule; Take 1 capsule (100 mg total) by mouth 2 (two) times daily as needed for cough.  Dispense: 20 capsule; Refill: 0 - montelukast (SINGULAIR) 10 MG tablet; Take 1 tablet (10 mg total) by mouth at bedtime.  Dispense: 30 tablet; Refill: 3  2. Mild intermittent asthma with acute exacerbation Persistent Prescribe Robitussin AC 1 tsp q 6 hrs. - predniSONE (DELTASONE) 10 MG tablet; Taper 4,4,4,3,3,3,2,2,2,1,1,1  Dispense: 53 tablet; Refill: 1 - guaiFENesin-codeine (ROBITUSSIN AC) 100-10 MG/5ML syrup; Take 5 mLs by mouth 3 (three) times daily as needed for cough.  Dispense: 150 mL; Refill: 0 - montelukast (SINGULAIR) 10 MG tablet;  Take 1 tablet (10 mg total) by mouth at bedtime.  Dispense: 30 tablet; Refill: 3  3. Mild intermittent asthma with acute exacerbation  - predniSONE (DELTASONE) 10 MG tablet; Taper 4,4,4,3,3,3,2,2,2,1,1,1  Dispense: 53 tablet; Refill: 1 - guaiFENesin-codeine (ROBITUSSIN AC) 100-10 MG/5ML syrup; Take 5 mLs by mouth 3 (three) times daily as needed for cough.  Dispense: 150 mL; Refill: 0 - montelukast (SINGULAIR) 10 MG tablet; Take 1 tablet (10 mg total) by mouth at bedtime.  Dispense: 30 tablet; Refill: 3  4. Bronchiolitis  - guaiFENesin-codeine (ROBITUSSIN AC) 100-10 MG/5ML syrup; Take 5 mLs by mouth 3 (three) times daily as needed for cough.  Dispense: 150 mL; Refill: 0  5. Acute maxillary sinusitis, recurrence not specified Acute. Persistent. AUGMENTIN 875 mg bid for 10 days. - amoxicillin-clavulanate (AUGMENTIN) 875-125 MG tablet; Take 1 tablet by mouth 2 (two) times daily.  Dispense: 20 tablet; Refill: 0   Dr. [facility]  05/01/2018

## 2018-05-30 ENCOUNTER — Other Ambulatory Visit: Payer: Self-pay | Admitting: Family Medicine

## 2018-05-30 DIAGNOSIS — F329 Major depressive disorder, single episode, unspecified: Secondary | ICD-10-CM

## 2018-06-12 ENCOUNTER — Ambulatory Visit: Payer: BC Managed Care – PPO | Admitting: Family Medicine

## 2018-06-12 ENCOUNTER — Encounter: Payer: Self-pay | Admitting: Family Medicine

## 2018-06-12 ENCOUNTER — Ambulatory Visit: Payer: Self-pay | Admitting: Orthopedic Surgery

## 2018-06-12 VITALS — BP 110/70 | HR 60 | Ht 69.0 in | Wt 280.0 lb

## 2018-06-12 DIAGNOSIS — Z01810 Encounter for preprocedural cardiovascular examination: Secondary | ICD-10-CM | POA: Diagnosis not present

## 2018-06-12 DIAGNOSIS — E668 Other obesity: Secondary | ICD-10-CM

## 2018-06-12 NOTE — Progress Notes (Signed)
Date:  06/12/2018   Name:  Annette JeffersonDiane E Finkle   DOB:  05/07/1961   MRN:  [id number]   Chief Complaint: Pre-op Exam (total R) knee replacement- sched on 08/09/18- no chest pains/ only had EKG due to asthma exacerbation when went to ER) Patient is a 57 year old female who presents for a presurgical  exam. The patient reports the following problems: no problems. Health maintenance has been reviewed. Pt to make appt for pap smear.  Chest Pain   Chronicity: no hx of chest pain./ for presurgical clearnce/EKG. Onset quality: none. Pertinent negatives include no abdominal pain, back pain, claudication, cough, diaphoresis, dizziness, exertional chest pressure, fever, headaches, hemoptysis, irregular heartbeat, lower extremity edema, nausea, near-syncope, numbness, palpitations, shortness of breath, syncope, vomiting or weakness. Risk factors include post-menopausal and stress.     Review of Systems  Constitutional: Negative.  Negative for chills, diaphoresis, fatigue, fever and unexpected weight change.  HENT: Negative for congestion, ear discharge, ear pain, rhinorrhea, sinus pressure, sneezing and sore throat.   Eyes: Negative for photophobia, pain, discharge, redness and itching.  Respiratory: Negative for cough, hemoptysis, shortness of breath, wheezing and stridor.   Cardiovascular: Positive for chest pain. Negative for palpitations, claudication, syncope and near-syncope.  Gastrointestinal: Negative for abdominal pain, blood in stool, constipation, diarrhea, nausea and vomiting.  Endocrine: Negative for cold intolerance, heat intolerance, polydipsia, polyphagia and polyuria.  Genitourinary: Negative for dysuria, flank pain, frequency, hematuria, menstrual problem, pelvic pain, urgency, vaginal bleeding and vaginal discharge.  Musculoskeletal: Negative for arthralgias, back pain and myalgias.  Skin: Negative for rash.  Allergic/Immunologic: Negative for environmental allergies and food allergies.    Neurological: Negative for dizziness, weakness, light-headedness, numbness and headaches.  Hematological: Negative for adenopathy. Does not bruise/bleed easily.  Psychiatric/Behavioral: Negative for dysphoric mood. The patient is not nervous/anxious.     There are no active problems to display for this patient.   Allergies  Allergen Reactions  . Ibuprofen Other (See Comments)    weightloss surgery    Past Surgical History:  Procedure Laterality Date  . BARIATRIC SURGERY     2011  . CHOLECYSTECTOMY    . HERNIA REPAIR    . HIATAL HERNIA REPAIR     1990's  . KNEE ARTHROSCOPY      Social History   Tobacco Use  . Smoking status: Former Games developermoker  . Smokeless tobacco: Never Used  Substance Use Topics  . Alcohol use: Yes    Comment: socially  . Drug use: No     Medication list has been reviewed and updated.  Current Meds  Medication Sig  . albuterol (PROVENTIL HFA;VENTOLIN HFA) 108 (90 Base) MCG/ACT inhaler Inhale 1-2 puffs into the lungs every 6 (six) hours as needed for wheezing or shortness of breath.  Marland Kitchen. albuterol (PROVENTIL) (2.5 MG/3ML) 0.083% nebulizer solution Take 3 mLs (2.5 mg total) by nebulization every 6 (six) hours as needed for wheezing or shortness of breath.  . Fluticasone-Salmeterol (ADVAIR DISKUS) 250-50 MCG/DOSE AEPB INHALE 1 PUFF INTO THE LUNGS TWICE A DAY  . ipratropium (ATROVENT) 0.02 % nebulizer solution Take 2.5 mLs (0.5 mg total) by nebulization 4 (four) times daily.  . montelukast (SINGULAIR) 10 MG tablet Take 1 tablet (10 mg total) by mouth at bedtime.  . Multiple Vitamin (MULTIVITAMIN) tablet Take 1 tablet by mouth daily.  . sertraline (ZOLOFT) 100 MG tablet TAKE 1 TABLET BY MOUTH EVERY DAY   Current Facility-Administered Medications for the 06/12/18 encounter (Office Visit) with Elizabeth SauerJones, Quincey Quesinberry  C, MD  Medication  . ipratropium-albuterol (DUONEB) 0.5-2.5 (3) MG/3ML nebulizer solution 3 mL    PHQ 2/9 Scores 06/12/2018 05/01/2018 12/07/2017 12/05/2017   PHQ - 2 Score 0 0 0 0  PHQ- 9 Score 0 - 0 1    Physical Exam  Constitutional: She is oriented to person, place, and time. She appears well-developed and well-nourished.  HENT:  Head: Normocephalic.  Right Ear: External ear normal.  Left Ear: External ear normal.  Mouth/Throat: Oropharynx is clear and moist.  Eyes: Pupils are equal, round, and reactive to light. Conjunctivae and EOM are normal. Lids are everted and swept, no foreign bodies found. Left eye exhibits no hordeolum. No foreign body present in the left eye. Right conjunctiva is not injected. Left conjunctiva is not injected. No scleral icterus.  Neck: Normal range of motion. Neck supple. No JVD present. No tracheal deviation present. No thyromegaly present.  Cardiovascular: Normal rate, regular rhythm, S1 normal, S2 normal, normal heart sounds and intact distal pulses. PMI is not displaced. Exam reveals no gallop, no S3, no S4 and no friction rub.  No murmur heard.  No systolic murmur is present.  No diastolic murmur is present. Pulmonary/Chest: Effort normal and breath sounds normal. No respiratory distress. She has no wheezes. She has no rales.  Abdominal: Soft. Bowel sounds are normal. She exhibits no mass. There is no hepatosplenomegaly. There is no tenderness. There is no rebound and no guarding.  Musculoskeletal: Normal range of motion. She exhibits no edema or tenderness.  Lymphadenopathy:    She has no cervical adenopathy.  Neurological: She is alert and oriented to person, place, and time. She has normal strength. She displays normal reflexes. No cranial nerve deficit.  Skin: Skin is warm. No rash noted.  Psychiatric: She has a normal mood and affect. Her mood appears not anxious. She does not exhibit a depressed mood.  Nursing note and vitals reviewed.   BP 110/70   Pulse 60   Ht 5\' 9"  (1.753 m)   Wt 280 lb (127 kg)   BMI 41.35 kg/m   Assessment and Plan: 1. Pre-operative cardiovascular examination Patient  to undergo total knee replacement.  Review of previous EKG notes possible septal infarct age unknown.  Reviewed history of any chest pain or cardiac issue which was all negative.  Patient has no contraindication to surgery or anesthesia.  2. Extreme obesity Patient patient has a BMI of over 40.  Mediterranean diet was discussed for weight loss and cardio preventative purposes.Health risks of being over weight were discussed and patient was counseled on weight loss options and exercise when possible.   Dr. [facility]  06/12/2018

## 2018-06-12 NOTE — Patient Instructions (Signed)
Mediterranean Diet A Mediterranean diet refers to food and lifestyle choices that are based on the traditions of countries located on the [place]. This way of eating has been shown to help prevent certain conditions and improve outcomes for people who have chronic diseases, like kidney disease and heart disease. What are tips for following this plan? Lifestyle  Cook and eat meals together with your family, when possible.  Drink enough fluid to keep your urine clear or pale yellow.  Be physically active every day. This includes: ? Aerobic exercise like running or swimming. ? Leisure activities like gardening, walking, or housework.  Get 7-8 hours of sleep each night.  If recommended by your health care provider, drink red wine in moderation. This means 1 glass a day for nonpregnant women and 2 glasses a day for men. A glass of wine equals 5 oz (150 mL). Reading food labels  Check the serving size of packaged foods. For foods such as rice and pasta, the serving size refers to the amount of cooked product, not dry.  Check the total fat in packaged foods. Avoid foods that have saturated fat or trans fats.  Check the ingredients list for added sugars, such as corn syrup. Shopping  At the grocery store, buy most of your food from the areas near the walls of the store. This includes: ? Fresh fruits and vegetables (produce). ? Grains, beans, nuts, and seeds. Some of these may be available in unpackaged forms or large amounts (in bulk). ? Fresh seafood. ? Poultry and eggs. ? Low-fat dairy products.  Buy whole ingredients instead of prepackaged foods.  Buy fresh fruits and vegetables in-season from local farmers markets.  Buy frozen fruits and vegetables in resealable bags.  If you do not have access to quality fresh seafood, buy precooked frozen shrimp or canned fish, such as tuna, salmon, or sardines.  Buy small amounts of raw or cooked vegetables, salads, or olives from the  deli or salad bar at your store.  Stock your pantry so you always have certain foods on hand, such as olive oil, canned tuna, canned tomatoes, rice, pasta, and beans. Cooking  Cook foods with extra-virgin olive oil instead of using butter or other vegetable oils.  Have meat as a side dish, and have vegetables or grains as your main dish. This means having meat in small portions or adding small amounts of meat to foods like pasta or stew.  Use beans or vegetables instead of meat in common dishes like chili or lasagna.  Experiment with different cooking methods. Try roasting or broiling vegetables instead of steaming or sauteing them.  Add frozen vegetables to soups, stews, pasta, or rice.  Add nuts or seeds for added healthy fat at each meal. You can add these to yogurt, salads, or vegetable dishes.  Marinate fish or vegetables using olive oil, lemon juice, garlic, and fresh herbs. Meal planning  Plan to eat 1 vegetarian meal one day each week. Try to work up to 2 vegetarian meals, if possible.  Eat seafood 2 or more times a week.  Have healthy snacks readily available, such as: ? Vegetable sticks with hummus. ? Greek yogurt. ? Fruit and nut trail mix.  Eat balanced meals throughout the week. This includes: ? Fruit: 2-3 servings a day ? Vegetables: 4-5 servings a day ? Low-fat dairy: 2 servings a day ? Fish, poultry, or lean meat: 1 serving a day ? Beans and legumes: 2 or more servings a week ? Nuts   and seeds: 1-2 servings a day ? Whole grains: 6-8 servings a day ? Extra-virgin olive oil: 3-4 servings a day  Limit red meat and sweets to only a few servings a month What are my food choices?  Mediterranean diet ? Recommended ? Grains: Whole-grain pasta. Brown rice. Bulgar wheat. Polenta. Couscous. Whole-wheat bread. Oatmeal. Quinoa. ? Vegetables: Artichokes. Beets. Broccoli. Cabbage. Carrots. Eggplant. Green beans. Chard. Kale. Spinach. Onions. Leeks. Peas. Squash.  Tomatoes. Peppers. Radishes. ? Fruits: Apples. Apricots. Avocado. Berries. Bananas. Cherries. Dates. Figs. Grapes. Lemons. Melon. Oranges. Peaches. Plums. Pomegranate. ? Meats and other protein foods: Beans. Almonds. Sunflower seeds. Pine nuts. Peanuts. Cod. Salmon. Scallops. Shrimp. Tuna. Tilapia. Clams. Oysters. Eggs. ? Dairy: Low-fat milk. Cheese. Greek yogurt. ? Beverages: Water. Red wine. Herbal tea. ? Fats and oils: Extra virgin olive oil. Avocado oil. Grape seed oil. ? Sweets and desserts: Greek yogurt with honey. Baked apples. Poached pears. Trail mix. ? Seasoning and other foods: Basil. Cilantro. Coriander. Cumin. Mint. Parsley. Sage. Rosemary. Tarragon. Garlic. Oregano. Thyme. Pepper. Balsalmic vinegar. Tahini. Hummus. Tomato sauce. Olives. Mushrooms. ? Limit these ? Grains: Prepackaged pasta or rice dishes. Prepackaged cereal with added sugar. ? Vegetables: Deep fried potatoes (french fries). ? Fruits: Fruit canned in syrup. ? Meats and other protein foods: Beef. Pork. Lamb. Poultry with skin. Hot dogs. Bacon. ? Dairy: Ice cream. Sour cream. Whole milk. ? Beverages: Juice. Sugar-sweetened soft drinks. Beer. Liquor and spirits. ? Fats and oils: Butter. Canola oil. Vegetable oil. Beef fat (tallow). Lard. ? Sweets and desserts: Cookies. Cakes. Pies. Candy. ? Seasoning and other foods: Mayonnaise. Premade sauces and marinades. ? The items listed may not be a complete list. Talk with your dietitian about what dietary choices are right for you. Summary  The Mediterranean diet includes both food and lifestyle choices.  Eat a variety of fresh fruits and vegetables, beans, nuts, seeds, and whole grains.  Limit the amount of red meat and sweets that you eat.  Talk with your health care provider about whether it is safe for you to drink red wine in moderation. This means 1 glass a day for nonpregnant women and 2 glasses a day for men. A glass of wine equals 5 oz (150 mL). This information  is not intended to replace advice given to you by your health care provider. Make sure you discuss any questions you have with your health care provider. Document Released: 02/12/2016 Document Revised: 03/16/2016 Document Reviewed: 02/12/2016 Elsevier Interactive Patient Education  2018 [employer]  

## 2018-07-18 ENCOUNTER — Other Ambulatory Visit: Payer: Self-pay | Admitting: Family Medicine

## 2018-07-18 DIAGNOSIS — J40 Bronchitis, not specified as acute or chronic: Secondary | ICD-10-CM

## 2018-07-18 DIAGNOSIS — J4521 Mild intermittent asthma with (acute) exacerbation: Secondary | ICD-10-CM

## 2018-07-26 ENCOUNTER — Encounter
Admission: RE | Admit: 2018-07-26 | Discharge: 2018-07-26 | Disposition: A | Discharge status: Home / Self Care | Payer: BC Managed Care – PPO | Source: Ambulatory Visit | Attending: Orthopedic Surgery | Admitting: Orthopedic Surgery

## 2018-07-26 ENCOUNTER — Other Ambulatory Visit: Payer: Self-pay

## 2018-07-26 DIAGNOSIS — Z01812 Encounter for preprocedural laboratory examination: Secondary | ICD-10-CM | POA: Diagnosis present

## 2018-07-26 HISTORY — DX: Emotional lability: R45.86

## 2018-07-26 HISTORY — DX: Gastro-esophageal reflux disease without esophagitis: K21.9

## 2018-07-26 HISTORY — DX: Pneumonia, unspecified organism: J18.9

## 2018-07-26 HISTORY — DX: Personal history of other diseases of the digestive system: Z87.19

## 2018-07-26 LAB — SURGICAL PCR SCREEN
MRSA, PCR: NEGATIVE
Staphylococcus aureus: NEGATIVE

## 2018-07-26 LAB — CBC
HEMATOCRIT: 46.7 % — AB (ref 36.0–46.0)
Hemoglobin: 15.9 g/dL — ABNORMAL HIGH (ref 12.0–15.0)
MCH: 30.7 pg (ref 26.0–34.0)
MCHC: 34 g/dL (ref 30.0–36.0)
MCV: 90.2 fL (ref 80.0–100.0)
Platelets: 260 10*3/uL (ref 150–400)
RBC: 5.18 MIL/uL — ABNORMAL HIGH (ref 3.87–5.11)
RDW: 11.7 % (ref 11.5–15.5)
WBC: 7.1 10*3/uL (ref 4.0–10.5)
nRBC: 0 % (ref 0.0–0.2)

## 2018-07-26 LAB — PROTIME-INR
INR: 1.11
Prothrombin Time: 14.2 seconds (ref 11.4–15.2)

## 2018-07-26 LAB — URINALYSIS, ROUTINE W REFLEX MICROSCOPIC
Bilirubin Urine: NEGATIVE
Glucose, UA: NEGATIVE mg/dL
Hgb urine dipstick: NEGATIVE
Ketones, ur: NEGATIVE mg/dL
LEUKOCYTES UA: NEGATIVE
Nitrite: NEGATIVE
Protein, ur: NEGATIVE mg/dL
Specific Gravity, Urine: 1.015 (ref 1.005–1.030)
pH: 5 (ref 5.0–8.0)

## 2018-07-26 LAB — APTT: aPTT: 34 seconds (ref 24–36)

## 2018-07-26 LAB — BASIC METABOLIC PANEL
Anion gap: 6 (ref 5–15)
BUN: 10 mg/dL (ref 6–20)
CO2: 25 mmol/L (ref 22–32)
Calcium: 8.9 mg/dL (ref 8.9–10.3)
Chloride: 108 mmol/L (ref 98–111)
Creatinine, Ser: 0.59 mg/dL (ref 0.44–1.00)
GFR calc Af Amer: >60 mL/min (ref >60)
GFR calc non Af Amer: >60 mL/min (ref >60)
Glucose, Bld: 91 mg/dL (ref 70–99)
Potassium: 4.2 mmol/L (ref 3.5–5.1)
Sodium: 139 mmol/L (ref 135–145)

## 2018-07-26 LAB — TYPE AND SCREEN
ABO/RH(D): A NEG
Antibody Screen: NEGATIVE

## 2018-07-26 NOTE — Patient Instructions (Signed)
Your procedure is scheduled on: August 09, 2018 Wednesday  Report to Day Surgery on the 2nd floor of [facility]. To find out your arrival time, please call [phone] between 1PM - 3PM on: Tuesday August 08, 2018  REMEMBER: Instructions that are not followed completely may result in serious medical risk, up to and including death; or upon the discretion of your surgeon and anesthesiologist your surgery may need to be rescheduled.  Do not eat food after midnight the night before surgery.  No gum chewing, lozengers or hard candies.  You may however, drink CLEAR liquids up to 2 hours before you are scheduled to arrive for your surgery. Do not drink anything within 2 hours of the start of your surgery.  Clear liquids include: - water  - apple juice without pulp - CLEAR gatorade - black coffee or tea (Do NOT add milk or creamers to the coffee or tea) Do NOT drink anything that is not on this list.  Type 1 and Type 2 diabetics should only drink water.  No Alcohol for 24 hours before or after surgery.  No Smoking including e-cigarettes for 24 hours prior to surgery.  No chewable tobacco products for at least 6 hours prior to surgery.  No nicotine patches on the day of surgery.  On the morning of surgery brush your teeth with toothpaste and water, you may rinse your mouth with mouthwash if you wish. Do not swallow any toothpaste or mouthwash.  Notify your doctor if there is any change in your medical condition (cold, fever, infection).  Do not wear jewelry, make-up, hairpins, clips or nail polish.  Do not wear lotions, powders, or perfumes.   Do not shave 48 hours prior to surgery.   Contacts and dentures may not be worn into surgery.  Do not bring valuables to the hospital, including drivers license, insurance or credit cards.  [facility] is not responsible for any belongings or valuables.   TAKE THESE MEDICATIONS THE MORNING OF SURGERY: ZOLOFT TRAMADOL  Use CHG  Soap or wipes as directed on instruction sheet.  Use inhalers on the day of surgery   Stop Anti-inflammatories (NSAIDS) such as Advil, Aleve, Ibuprofen, Motrin, Naproxen, Naprosyn and Aspirin based products such as Excedrin, Goodys Powder, BC Powder. (May take Tylenol or Acetaminophen if needed.) FOR 7-10 DAYS BEFORE SURGERY  Stop ANY OVER THE COUNTER supplements until after surgery. FOR 7 DAY BEFORE SURGERY  (May continue Vitamin D, Vitamin B, and multivitamin.)  Wear comfortable clothing (specific to your surgery type) to the hospital.  Plan for stool softeners for home use.  If you are being admitted to the hospital overnight, leave your suitcase in the car. After surgery it may be brought to your room.  If you are being discharged the day of surgery, you will not be allowed to drive home. You will need a responsible adult to drive you home and stay with you that night.   If you are taking public transportation, you will need to have a responsible adult with you. Please confirm with your physician that it is acceptable to use public transportation.   Please call [phone] if you have any questions about these instructions.

## 2018-08-08 MED ORDER — TRANEXAMIC ACID-NACL 1000-0.7 MG/100ML-% IV SOLN
1000.0000 mg | INTRAVENOUS | Status: AC
  Administered 2018-08-09: 1000 mg via INTRAVENOUS
  Filled 2018-08-08: qty 100

## 2018-08-08 MED ORDER — DEXTROSE 5 % IV SOLN
3.0000 g | INTRAVENOUS | Status: AC
  Administered 2018-08-09: 3 g via INTRAVENOUS
  Filled 2018-08-08: qty 3

## 2018-08-09 ENCOUNTER — Encounter: Payer: Self-pay | Admitting: *Deleted

## 2018-08-09 ENCOUNTER — Inpatient Hospital Stay: Payer: BC Managed Care – PPO

## 2018-08-09 ENCOUNTER — Inpatient Hospital Stay
Admission: RE | Admit: 2018-08-09 | Discharge: 2018-08-11 | DRG: 470 | Disposition: A | Discharge status: Home Health | Payer: BC Managed Care – PPO | Attending: Orthopedic Surgery | Admitting: Orthopedic Surgery

## 2018-08-09 ENCOUNTER — Encounter
Admission: RE | Disposition: A | Discharge status: Home Health | Payer: Self-pay | Source: Home / Self Care | Attending: Orthopedic Surgery

## 2018-08-09 ENCOUNTER — Other Ambulatory Visit: Payer: Self-pay

## 2018-08-09 ENCOUNTER — Inpatient Hospital Stay: Payer: BC Managed Care – PPO | Admitting: Certified Registered"

## 2018-08-09 DIAGNOSIS — M1711 Unilateral primary osteoarthritis, right knee: Secondary | ICD-10-CM | POA: Diagnosis present

## 2018-08-09 DIAGNOSIS — J45909 Unspecified asthma, uncomplicated: Secondary | ICD-10-CM | POA: Diagnosis present

## 2018-08-09 DIAGNOSIS — Z79899 Other long term (current) drug therapy: Secondary | ICD-10-CM

## 2018-08-09 DIAGNOSIS — M25761 Osteophyte, right knee: Secondary | ICD-10-CM | POA: Diagnosis present

## 2018-08-09 DIAGNOSIS — Z96651 Presence of right artificial knee joint: Secondary | ICD-10-CM

## 2018-08-09 DIAGNOSIS — Z87891 Personal history of nicotine dependence: Secondary | ICD-10-CM | POA: Diagnosis not present

## 2018-08-09 DIAGNOSIS — K219 Gastro-esophageal reflux disease without esophagitis: Secondary | ICD-10-CM | POA: Diagnosis present

## 2018-08-09 DIAGNOSIS — M25561 Pain in right knee: Secondary | ICD-10-CM | POA: Diagnosis present

## 2018-08-09 DIAGNOSIS — Z09 Encounter for follow-up examination after completed treatment for conditions other than malignant neoplasm: Secondary | ICD-10-CM

## 2018-08-09 DIAGNOSIS — Z7952 Long term (current) use of systemic steroids: Secondary | ICD-10-CM | POA: Diagnosis not present

## 2018-08-09 DIAGNOSIS — F329 Major depressive disorder, single episode, unspecified: Secondary | ICD-10-CM | POA: Diagnosis present

## 2018-08-09 HISTORY — PX: TOTAL KNEE ARTHROPLASTY: SHX125

## 2018-08-09 LAB — ABO/RH: ABO/RH(D): A NEG

## 2018-08-09 SURGERY — ARTHROPLASTY, KNEE, TOTAL
Anesthesia: Spinal | Laterality: Right

## 2018-08-09 MED ORDER — PROPOFOL 500 MG/50ML IV EMUL
INTRAVENOUS | Status: AC
  Filled 2018-08-09: qty 50

## 2018-08-09 MED ORDER — FAMOTIDINE 20 MG PO TABS
20.0000 mg | ORAL_TABLET | Freq: Once | ORAL | Status: AC
  Administered 2018-08-09: 20 mg via ORAL

## 2018-08-09 MED ORDER — GLYCOPYRROLATE 0.2 MG/ML IJ SOLN
INTRAMUSCULAR | Status: AC
  Filled 2018-08-09: qty 1

## 2018-08-09 MED ORDER — MAGNESIUM HYDROXIDE 400 MG/5ML PO SUSP: 30.0000 mL | Freq: Every day | ORAL | Status: DC | PRN

## 2018-08-09 MED ORDER — KETOROLAC TROMETHAMINE 15 MG/ML IJ SOLN
15.0000 mg | Freq: Four times a day (QID) | INTRAMUSCULAR | Status: AC
  Administered 2018-08-09 – 2018-08-10 (×3): 15 mg via INTRAVENOUS
  Filled 2018-08-09 (×4): qty 1

## 2018-08-09 MED ORDER — PROPOFOL 10 MG/ML IV BOLUS
INTRAVENOUS | Status: AC
  Filled 2018-08-09: qty 40

## 2018-08-09 MED ORDER — BUPIVACAINE-EPINEPHRINE (PF) 0.5% -1:200000 IJ SOLN
INTRAMUSCULAR | Status: DC | PRN
  Administered 2018-08-09: 30 mL via PERINEURAL

## 2018-08-09 MED ORDER — FAMOTIDINE 20 MG PO TABS
ORAL_TABLET | ORAL | Status: AC
  Administered 2018-08-09: 20 mg via ORAL
  Filled 2018-08-09: qty 1

## 2018-08-09 MED ORDER — PROPOFOL 500 MG/50ML IV EMUL
INTRAVENOUS | Status: DC | PRN
  Administered 2018-08-09: 75 ug/kg/min via INTRAVENOUS

## 2018-08-09 MED ORDER — EPHEDRINE SULFATE 50 MG/ML IJ SOLN
INTRAMUSCULAR | Status: DC | PRN
  Administered 2018-08-09: 5 mg via INTRAVENOUS
  Administered 2018-08-09 (×3): 10 mg via INTRAVENOUS

## 2018-08-09 MED ORDER — B COMPLEX-C PO TABS
1.0000 | ORAL_TABLET | ORAL | Status: DC
  Administered 2018-08-10: 1 via ORAL
  Filled 2018-08-09: qty 1

## 2018-08-09 MED ORDER — LACTATED RINGERS IV BOLUS: 500.0000 mL | Freq: Once | INTRAVENOUS | Status: DC

## 2018-08-09 MED ORDER — BUPIVACAINE LIPOSOME 1.3 % IJ SUSP
INTRAMUSCULAR | Status: AC
  Filled 2018-08-09: qty 20

## 2018-08-09 MED ORDER — METOCLOPRAMIDE HCL 5 MG/ML IJ SOLN: 5.0000 mg | Freq: Three times a day (TID) | INTRAMUSCULAR | Status: DC | PRN

## 2018-08-09 MED ORDER — SODIUM CHLORIDE 0.9 % IV SOLN
INTRAVENOUS | Status: DC | PRN
  Administered 2018-08-09: 60 mL

## 2018-08-09 MED ORDER — IPRATROPIUM BROMIDE 0.02 % IN SOLN
0.5000 mg | Freq: Four times a day (QID) | RESPIRATORY_TRACT | Status: DC
  Administered 2018-08-09 – 2018-08-10 (×3): 0.5 mg via RESPIRATORY_TRACT
  Filled 2018-08-09 (×3): qty 2.5

## 2018-08-09 MED ORDER — ACETAMINOPHEN 500 MG PO TABS
500.0000 mg | ORAL_TABLET | Freq: Four times a day (QID) | ORAL | Status: AC
  Administered 2018-08-09 – 2018-08-10 (×3): 500 mg via ORAL
  Filled 2018-08-09 (×3): qty 1

## 2018-08-09 MED ORDER — ROPIVACAINE HCL 5 MG/ML IJ SOLN
INTRAMUSCULAR | Status: AC
  Filled 2018-08-09: qty 30

## 2018-08-09 MED ORDER — ACETAMINOPHEN 325 MG PO TABS: 325.0000 mg | ORAL_TABLET | Freq: Four times a day (QID) | ORAL | Status: DC | PRN

## 2018-08-09 MED ORDER — ACETAMINOPHEN 500 MG PO TABS
ORAL_TABLET | ORAL | Status: AC
  Administered 2018-08-09: 1000 mg via ORAL
  Filled 2018-08-09: qty 2

## 2018-08-09 MED ORDER — BACITRACIN 50000 UNITS IM SOLR
INTRAMUSCULAR | Status: AC
  Filled 2018-08-09: qty 2

## 2018-08-09 MED ORDER — MORPHINE SULFATE (PF) 2 MG/ML IV SOLN
0.5000 mg | INTRAVENOUS | Status: DC | PRN
  Administered 2018-08-09 – 2018-08-10 (×2): 1 mg via INTRAVENOUS
  Filled 2018-08-09 (×2): qty 1

## 2018-08-09 MED ORDER — LIDOCAINE HCL (PF) 1 % IJ SOLN
INTRAMUSCULAR | Status: AC
  Filled 2018-08-09: qty 5

## 2018-08-09 MED ORDER — RIVAROXABAN 10 MG PO TABS
10.0000 mg | ORAL_TABLET | Freq: Every day | ORAL | Status: DC
  Administered 2018-08-10 – 2018-08-11 (×2): 10 mg via ORAL
  Filled 2018-08-09 (×2): qty 1

## 2018-08-09 MED ORDER — MONTELUKAST SODIUM 10 MG PO TABS
10.0000 mg | ORAL_TABLET | Freq: Every day | ORAL | Status: DC
  Administered 2018-08-09 – 2018-08-10 (×2): 10 mg via ORAL
  Filled 2018-08-09 (×2): qty 1

## 2018-08-09 MED ORDER — GABAPENTIN 300 MG PO CAPS
ORAL_CAPSULE | ORAL | Status: AC
  Administered 2018-08-09: 300 mg via ORAL
  Filled 2018-08-09: qty 1

## 2018-08-09 MED ORDER — VITAMIN D 25 MCG (1000 UNIT) PO TABS
1000.0000 [IU] | ORAL_TABLET | Freq: Every day | ORAL | Status: DC
  Administered 2018-08-10: 1000 [IU] via ORAL
  Filled 2018-08-09: qty 1

## 2018-08-09 MED ORDER — LACTATED RINGERS IV SOLN
INTRAVENOUS | Status: DC
  Administered 2018-08-09: 10:00:00 via INTRAVENOUS

## 2018-08-09 MED ORDER — SODIUM CHLORIDE 0.9 % IR SOLN
Status: DC | PRN
  Administered 2018-08-09: 11:00:00

## 2018-08-09 MED ORDER — TRAMADOL HCL 50 MG PO TABS
50.0000 mg | ORAL_TABLET | Freq: Four times a day (QID) | ORAL | Status: DC
  Administered 2018-08-09 – 2018-08-11 (×7): 50 mg via ORAL
  Filled 2018-08-09 (×7): qty 1

## 2018-08-09 MED ORDER — FENTANYL CITRATE (PF) 100 MCG/2ML IJ SOLN
INTRAMUSCULAR | Status: AC
  Administered 2018-08-09: 25 ug
  Filled 2018-08-09: qty 2

## 2018-08-09 MED ORDER — CHLORHEXIDINE GLUCONATE 4 % EX LIQD: 60.0000 mL | Freq: Once | CUTANEOUS | Status: DC

## 2018-08-09 MED ORDER — BISACODYL 10 MG RE SUPP: 10.0000 mg | Freq: Every day | RECTAL | Status: DC | PRN

## 2018-08-09 MED ORDER — ZINC SULFATE 220 (50 ZN) MG PO CAPS
220.0000 mg | ORAL_CAPSULE | Freq: Two times a day (BID) | ORAL | Status: DC
  Administered 2018-08-09 – 2018-08-10 (×3): 220 mg via ORAL
  Filled 2018-08-09 (×5): qty 1

## 2018-08-09 MED ORDER — GLYCOPYRROLATE 0.2 MG/ML IJ SOLN
0.2000 mg | Freq: Once | INTRAMUSCULAR | Status: AC
  Administered 2018-08-09: 0.2 mg via INTRAVENOUS

## 2018-08-09 MED ORDER — SERTRALINE HCL 50 MG PO TABS
100.0000 mg | ORAL_TABLET | Freq: Every day | ORAL | Status: DC
  Administered 2018-08-10: 100 mg via ORAL
  Filled 2018-08-09: qty 2

## 2018-08-09 MED ORDER — MIDAZOLAM HCL 2 MG/2ML IJ SOLN
INTRAMUSCULAR | Status: AC
  Filled 2018-08-09: qty 2

## 2018-08-09 MED ORDER — ALBUTEROL SULFATE HFA 108 (90 BASE) MCG/ACT IN AERS: 1.0000 | INHALATION_SPRAY | Freq: Four times a day (QID) | RESPIRATORY_TRACT | Status: DC | PRN

## 2018-08-09 MED ORDER — PHENOL 1.4 % MT LIQD
1.0000 | OROMUCOSAL | Status: DC | PRN
  Filled 2018-08-09: qty 177

## 2018-08-09 MED ORDER — MIDAZOLAM HCL 5 MG/5ML IJ SOLN
INTRAMUSCULAR | Status: DC | PRN
  Administered 2018-08-09: 2 mg via INTRAVENOUS

## 2018-08-09 MED ORDER — SODIUM CHLORIDE FLUSH 0.9 % IV SOLN
INTRAVENOUS | Status: AC
  Filled 2018-08-09: qty 60

## 2018-08-09 MED ORDER — HYDROCODONE-ACETAMINOPHEN 5-325 MG PO TABS
1.0000 | ORAL_TABLET | ORAL | Status: DC | PRN
  Administered 2018-08-09: 2 via ORAL
  Filled 2018-08-09: qty 2

## 2018-08-09 MED ORDER — HYDROCODONE-ACETAMINOPHEN 7.5-325 MG PO TABS
1.0000 | ORAL_TABLET | ORAL | Status: DC | PRN
  Administered 2018-08-09 – 2018-08-11 (×6): 2 via ORAL
  Filled 2018-08-09 (×7): qty 2

## 2018-08-09 MED ORDER — GABAPENTIN 300 MG PO CAPS
300.0000 mg | ORAL_CAPSULE | Freq: Three times a day (TID) | ORAL | Status: DC
  Administered 2018-08-09 – 2018-08-10 (×4): 300 mg via ORAL
  Filled 2018-08-09 (×4): qty 1

## 2018-08-09 MED ORDER — ROPIVACAINE HCL 5 MG/ML IJ SOLN
INTRAMUSCULAR | Status: DC | PRN
  Administered 2018-08-09: 20 mL via PERINEURAL

## 2018-08-09 MED ORDER — MOMETASONE FURO-FORMOTEROL FUM 200-5 MCG/ACT IN AERO
2.0000 | INHALATION_SPRAY | Freq: Two times a day (BID) | RESPIRATORY_TRACT | Status: DC
  Administered 2018-08-09 – 2018-08-11 (×4): 2 via RESPIRATORY_TRACT
  Filled 2018-08-09: qty 8.8

## 2018-08-09 MED ORDER — METOCLOPRAMIDE HCL 10 MG PO TABS: 5.0000 mg | ORAL_TABLET | Freq: Three times a day (TID) | ORAL | Status: DC | PRN

## 2018-08-09 MED ORDER — GABAPENTIN 300 MG PO CAPS
300.0000 mg | ORAL_CAPSULE | Freq: Once | ORAL | Status: AC
  Administered 2018-08-09: 300 mg via ORAL

## 2018-08-09 MED ORDER — BUPIVACAINE-EPINEPHRINE (PF) 0.5% -1:200000 IJ SOLN
INTRAMUSCULAR | Status: AC
  Filled 2018-08-09: qty 30

## 2018-08-09 MED ORDER — ACETAMINOPHEN 500 MG PO TABS
1000.0000 mg | ORAL_TABLET | Freq: Once | ORAL | Status: AC
  Administered 2018-08-09: 1000 mg via ORAL

## 2018-08-09 MED ORDER — ONDANSETRON HCL 4 MG/2ML IJ SOLN: 4.0000 mg | Freq: Four times a day (QID) | INTRAMUSCULAR | Status: DC | PRN

## 2018-08-09 MED ORDER — MIDAZOLAM HCL 2 MG/2ML IJ SOLN
INTRAMUSCULAR | Status: AC
  Administered 2018-08-09: 2 mg
  Filled 2018-08-09: qty 2

## 2018-08-09 MED ORDER — ALBUTEROL SULFATE (2.5 MG/3ML) 0.083% IN NEBU: 2.5000 mg | INHALATION_SOLUTION | Freq: Four times a day (QID) | RESPIRATORY_TRACT | Status: DC | PRN

## 2018-08-09 MED ORDER — DOCUSATE SODIUM 100 MG PO CAPS
100.0000 mg | ORAL_CAPSULE | Freq: Two times a day (BID) | ORAL | Status: DC
  Administered 2018-08-09 – 2018-08-10 (×2): 100 mg via ORAL
  Filled 2018-08-09 (×2): qty 1

## 2018-08-09 MED ORDER — VITAMIN C 500 MG PO TABS
1500.0000 mg | ORAL_TABLET | Freq: Every day | ORAL | Status: DC
  Administered 2018-08-10: 1500 mg via ORAL
  Filled 2018-08-09: qty 3

## 2018-08-09 MED ORDER — ONDANSETRON HCL 4 MG PO TABS: 4.0000 mg | ORAL_TABLET | Freq: Four times a day (QID) | ORAL | Status: DC | PRN

## 2018-08-09 MED ORDER — FENTANYL CITRATE (PF) 100 MCG/2ML IJ SOLN
INTRAMUSCULAR | Status: DC | PRN
  Administered 2018-08-09 (×2): 50 ug via INTRAVENOUS

## 2018-08-09 MED ORDER — FENTANYL CITRATE (PF) 100 MCG/2ML IJ SOLN
INTRAMUSCULAR | Status: AC
  Filled 2018-08-09: qty 2

## 2018-08-09 MED ORDER — MENTHOL 3 MG MT LOZG
1.0000 | LOZENGE | OROMUCOSAL | Status: DC | PRN
  Filled 2018-08-09: qty 9

## 2018-08-09 MED ORDER — LACTATED RINGERS IV SOLN
INTRAVENOUS | Status: DC
  Administered 2018-08-09: 15:00:00 via INTRAVENOUS

## 2018-08-09 MED ORDER — MAGNESIUM CITRATE PO SOLN
1.0000 | Freq: Once | ORAL | Status: DC | PRN
  Filled 2018-08-09: qty 296

## 2018-08-09 SURGICAL SUPPLY — 60 items
BASEPLATE TIBIAL RT SZ 5 (Knees) ×1 IMPLANT
BLADE SAW 18WX90L 1.27 THK (BLADE) ×2 IMPLANT
BLADE SAW SAG 25X90X1.19 (BLADE) ×2 IMPLANT
BOWL CEMENT MIX W/ADAPTER (MISCELLANEOUS) ×2 IMPLANT
BRUSH SCRUB EZ  4% CHG (MISCELLANEOUS) ×2
BRUSH SCRUB EZ 4% CHG (MISCELLANEOUS) ×2 IMPLANT
BSPLAT TIB 5 CMNT M TPR KN RT (Knees) ×1 IMPLANT
CANISTER SUCT 1200ML W/VALVE (MISCELLANEOUS) ×2 IMPLANT
CANISTER SUCT 3000ML PPV (MISCELLANEOUS) ×4 IMPLANT
CEMENT BONE 1-PACK (Cement) ×4 IMPLANT
CHLORAPREP W/TINT 26ML (MISCELLANEOUS) ×4 IMPLANT
COMP PATELLA FENESIS 32 OVAL (Stem) ×2 IMPLANT
COMPONENT FEM OXINIUM SZ 6 RT (Knees) ×2 IMPLANT
COMPONENT PTLLA GENS 32 OVAL (Stem) ×1 IMPLANT
COOLER POLAR GLACIER W/PUMP (MISCELLANEOUS) ×2 IMPLANT
COVER WAND RF STERILE (DRAPES) ×2 IMPLANT
CUFF TOURN 30 STER DUAL PORT (MISCELLANEOUS) ×2 IMPLANT
DRAPE INCISE IOBAN 66X60 STRL (DRAPES) ×2 IMPLANT
DRAPE SHEET LG 3/4 BI-LAMINATE (DRAPES) ×2 IMPLANT
ELECT REM PT RETURN 9FT ADLT (ELECTROSURGICAL) ×2
ELECTRODE REM PT RTRN 9FT ADLT (ELECTROSURGICAL) ×1 IMPLANT
GAUZE PETRO XEROFOAM 1X8 (MISCELLANEOUS) ×2 IMPLANT
GAUZE SPONGE 4X4 12PLY STRL (GAUZE/BANDAGES/DRESSINGS) ×2 IMPLANT
GLOVE BIO SURGEON STRL SZ8 (GLOVE) ×2 IMPLANT
GLOVE BIOGEL PI IND STRL 8.5 (GLOVE) ×1 IMPLANT
GLOVE BIOGEL PI INDICATOR 8.5 (GLOVE) ×1
GLOVE INDICATOR 8.0 STRL GRN (GLOVE) ×8 IMPLANT
GLOVE SURG ORTHO 8.0 STRL STRW (GLOVE) ×4 IMPLANT
GOWN STRL REUS W/ TWL LRG LVL3 (GOWN DISPOSABLE) ×2 IMPLANT
GOWN STRL REUS W/ TWL XL LVL3 (GOWN DISPOSABLE) ×2 IMPLANT
GOWN STRL REUS W/TWL LRG LVL3 (GOWN DISPOSABLE) ×4
GOWN STRL REUS W/TWL XL LVL3 (GOWN DISPOSABLE) ×4
HOOD PEEL AWAY FLYTE STAYCOOL (MISCELLANEOUS) ×8 IMPLANT
INSERT ART HI FLEX 9 SZ5-6 (Insert) ×2 IMPLANT
IV NS 1000ML (IV SOLUTION) ×2
IV NS 1000ML BAXH (IV SOLUTION) ×1 IMPLANT
KIT TURNOVER KIT A (KITS) ×2 IMPLANT
MAT ABSORB  FLUID 56X50 GRAY (MISCELLANEOUS) ×1
MAT ABSORB FLUID 56X50 GRAY (MISCELLANEOUS) ×1 IMPLANT
NDL SAFETY ECLIPSE 18X1.5 (NEEDLE) ×1 IMPLANT
NEEDLE HYPO 18GX1.5 SHARP (NEEDLE) ×2
NEEDLE SPNL 20GX3.5 QUINCKE YW (NEEDLE) ×2 IMPLANT
NS IRRIG 1000ML POUR BTL (IV SOLUTION) ×2 IMPLANT
PACK TOTAL KNEE (MISCELLANEOUS) ×2 IMPLANT
PAD DE MAYO PRESSURE PROTECT (MISCELLANEOUS) ×2 IMPLANT
PAD WRAPON POLAR KNEE (MISCELLANEOUS) ×1 IMPLANT
PULSAVAC PLUS IRRIG FAN TIP (DISPOSABLE) ×2
STAPLER SKIN PROX 35W (STAPLE) ×2 IMPLANT
SUCTION FRAZIER HANDLE 10FR (MISCELLANEOUS) ×1
SUCTION TUBE FRAZIER 10FR DISP (MISCELLANEOUS) ×1 IMPLANT
SUT DVC 2 QUILL PDO  T11 36X36 (SUTURE) ×1
SUT DVC 2 QUILL PDO T11 36X36 (SUTURE) ×1 IMPLANT
SUT VIC AB 2-0 CT1 18 (SUTURE) ×2 IMPLANT
SUT VIC AB 2-0 CT1 27 (SUTURE)
SUT VIC AB 2-0 CT1 TAPERPNT 27 (SUTURE) IMPLANT
SUT VIC AB PLUS 45CM 1-MO-4 (SUTURE) ×2 IMPLANT
SYR 30ML LL (SYRINGE) ×6 IMPLANT
TIBIAL BASEPLATE SZ 5 (Knees) ×2 IMPLANT
TIP FAN IRRIG PULSAVAC PLUS (DISPOSABLE) ×1 IMPLANT
WRAPON POLAR PAD KNEE (MISCELLANEOUS) ×2

## 2018-08-09 NOTE — Op Note (Signed)
DATE OF SURGERY:  08/09/2018 TIME: 12:29 PM  PATIENT NAME:  Annette Perez   AGE: 58 y.o.    PRE-OPERATIVE DIAGNOSIS:  OSTEOARTHRITIS OF RIGHT KNEE  POST-OPERATIVE DIAGNOSIS:  Same  PROCEDURE:  Procedure(s): TOTAL KNEE ARTHROPLASTY, RIGHT  SURGEON:  Lyndle Herrlich, MD   ASSISTANT:  Altamese Cabal, PA-C  OPERATIVE IMPLANTS: Katrinka Blazing [employer] Nephew, Cruciate Retaining Oxinium Femoral component size  6, Fixed Bearing Tray size 5, Patella polyethylene 3-peg oval button size 32 mm, with a 9 mm HighFlex insert.   PREOPERATIVE INDICATIONS:  [name] is an 58 y.o. female who has a diagnosis of OSTEOARTHRITIS OF RIGHT KNEE and elected for a right total knee arthroplasty after failing nonoperative treatment, including activity modification, pain medication, physical therapy and injections who has significant impairment of their activities of daily living.  Radiographs have demonstrated tricompartmental osteoarthritis joint space narrowing, osteophytes, subchondral sclerosis and cyst formation.  The risks, benefits, and alternatives were discussed at length including but not limited to the risks of infection, bleeding, nerve or blood vessel injury, knee stiffness, fracture, dislocation, loosening or failure of the hardware and the need for further surgery. Medical risks include but not limited to DVT and pulmonary embolism, myocardial infarction, stroke, pneumonia, respiratory failure and death. I discussed these risks with the patient in my office prior to the date of surgery. They understood these risks and were willing to proceed.  OPERATIVE FINDINGS AND UNIQUE ASPECTS OF THE CASE:  All three compartments with advanced and severe degenerative changes, large osteophytes and an abundance of synovial fluid. Significant deformity was also noted. A decision was made to proceed with total knee arthroplasty.   OPERATIVE DESCRIPTION:  The patient was brought to the operative room and placed in a supine  position after undergoing placement of a general anesthetic. IV antibiotics were given. Patient received tranexamic acid. The lower extremity was prepped and draped in the usual sterile fashion.  A time out was performed to verify the patient's name, date of birth, medical record number, correct site of surgery and correct procedure to be performed. The timeout was also used to confirm the patient received antibiotics and that appropriate instruments, implants and radiographs studies were available in the room.  The leg was elevated and exsanguinated with an Esmarch and the tourniquet was inflated to 275 mmHg.  A midline incision was made over the left knee.. A medial parapatellar arthrotomy was then made and the patella subluxed laterally and the knee was brought into 90 of flexion. Hoffa's fat pad along with the anterior cruciate ligament was resected and the medial joint line was exposed.  Attention was then turned to preparation of the patella. The thickness of the patella was measured with a caliper, the diameter measured with the patella templates.  The patella resection was then made with an oscillating saw using the patella cutting guide.  The 32 mm button fit appropriately.  3 peg holes for the patella component were then drilled.  The extramedullary tibial cutting guide was then placed using the anterior tibial crest and second ray of the foot as a reference.  The tibial cutting guide was adjusted to allow for appropriate posterior slope.  The tibial cutting block was pinned into position. The slotted stylus was used to measure the proximal tibial resection of 9 mm off the high lateral side. Care was taken during the tibial resection to protect the medial and collateral ligaments.  The resected tibial bone was removed.  The distal femur  was resected using the Visionaire cutting guide.  Care was taken to protect the collateral ligaments during distal femoral resection.  The distal femoral resection  was performed with an oscillating saw. The femoral cutting guide was then removed. Extension gap was measured with a 9 mm spacer block and alignment and extension was confirmed using a long alignment rod. The femur was sized to be a 6. Rotation of the referencing guide was checked with the epicondylar axis and Whitesides line. Then the 4-in-1 cutting jig was then applied to the distal femur. A stylus was used to confirm that the anterior femur would not be notched.   Then the anterior, posterior and chamfer femoral cuts were then made with an oscillating saw.  The knee was distracted and all posterior osteophytes were removed.  The flexion gap was then measured with a flexion spacer block and long alignment rod and was found to be symmetric with the extension gap and perpendicular to mechanical axis of the tibia.  The proximal tibia plateau was then sized with trial trays. The best coverage was achieved with a size 5. This tibial tray was then pinned into position. The proximal tibia was then prepared with the keel punch.  After tibial preparation was completed, all trial components were inserted with polyethylene trials. The knee achieved full extension and flexed to 120 degrees. Ligament were stable to varus and valgus at full extension as well as 30, 60 and 90 degrees of flexion.   The trials were then placed. Knee was taken through a full range of motion and deemed to be stable with the trial components. All trial components were then removed.  The joint was copiously irrigated with pulse lavage.  The final total knee arthroplasty components were then cemented into place. The knee was held in extension while cement was allowed to cure.The knee was taken through a range of motion and the patella tracked well and the knee was again irrigated copiously.  The knee capsule was then injected with Exparel.  The medial arthrotomy was closed with #1 Vicryl and #2 Quill. The subcutaneous tissue closed with  2-0  vicryl, and skin approximated with staples.  A dry sterile and compressive dressing was applied.  A Polar Care was applied to the operative knee.  The patient was awakened and brought to the PACU in stable and satisfactory condition.  All sharp, lap and instrument counts were correct at the conclusion the case. I spoke with the patient's family in the postop consultation room to let them know the case had been performed without complication and the patient was stable in recovery room.   Total tourniquet time was 51 minutes.

## 2018-08-09 NOTE — H&P (Signed)
The patient has been re-examined, and the chart reviewed, and there have been no interval changes to the documented history and physical.  Plan a right total knee replacement today.  Anesthesia is consulted regarding a peripheral nerve block for post-operative pain.  The risks, benefits, and alternatives have been discussed at length, and the patient is willing to proceed.     

## 2018-08-09 NOTE — Progress Notes (Signed)
robinual given for heart rate 48 to 50   Bolus given for low blood pressure

## 2018-08-09 NOTE — Anesthesia Post-op Follow-up Note (Signed)
Anesthesia QCDR form completed.        

## 2018-08-09 NOTE — Transfer of Care (Signed)
Immediate Anesthesia Transfer of Care Note  Patient: Annette Perez  Procedure(s) Performed: TOTAL KNEE ARTHROPLASTY (Right )  Patient Location: PACU  Anesthesia Type:Spinal  Level of Consciousness: awake, alert  and oriented  Airway & Oxygen Therapy: Patient Spontanous Breathing  Post-op Assessment: Report given to RN and Post -op Vital signs reviewed and stable  Post vital signs: Reviewed and stable  Last Vitals:  Vitals Value Taken Time  BP    Temp    Pulse    Resp    SpO2      Last Pain:  Vitals:   08/09/18 0818  TempSrc: Temporal  PainSc: 0-No pain         Complications: No apparent anesthesia complications

## 2018-08-09 NOTE — Anesthesia Procedure Notes (Signed)
Spinal  Patient location during procedure: OR Start time: 08/09/2018 10:40 AM Staffing Resident/CRNA: Yovanni Frenette, Anne Ng, CRNA Performed: resident/CRNA  Preanesthetic Checklist Completed: patient identified, site marked, surgical consent, pre-op evaluation, timeout performed, IV checked, risks and benefits discussed and monitors and equipment checked Spinal Block Patient position: sitting Prep: ChloraPrep Patient monitoring: heart rate, cardiac monitor, continuous pulse ox and blood pressure Approach: midline Location: L3-4 Injection technique: single-shot Needle Needle type: Pencil-Tip  Needle gauge: 22 G Needle length: 5 cm Assessment Sensory level: T8

## 2018-08-09 NOTE — Anesthesia Procedure Notes (Addendum)
Anesthesia Regional Block: Adductor canal block   Pre-Anesthetic Checklist: ,, timeout performed, Correct Patient, Correct Site, Correct Laterality, Correct Procedure, Correct Position, site marked, Risks and benefits discussed,  Surgical consent,  Pre-op evaluation,  At surgeon's request and post-op pain management  Laterality: Right  Prep: chloraprep       Needles:  Injection technique: Single-shot  Needle Type: Echogenic Needle     Needle Length: 10cm  Needle Gauge: 20     Additional Needles:   Procedures:,,,, ultrasound used (permanent image in chart),,,,  Narrative:  Injection made incrementally with aspirations every 5 mL.  Performed by: Personally  Anesthesiologist: Yevette Edwards, MD  Additional Notes: Patient consented for risk and benefits of nerve block including but not limited to nerve damage, failed block, bleeding and infection.  Patient voiced understanding.  Functioning IV was confirmed and monitors were applied.  A echogenic needle was used. Sterile prep,hand hygiene and sterile gloves were used. Minimal sedation used for procedure.   No paresthesia endorsed by patient during the procedure.  Negative aspiration and negative test dose prior to incremental administration of local anesthetic. 20 cc 0.5% Ropivicaine injected without pain or iv sx.  The patient tolerated the procedure well with no immediate complications.

## 2018-08-09 NOTE — Progress Notes (Signed)
PT Cancellation Note  Patient Details Name: Annette JeffersonDiane E Perez MRN: [id number] DOB: 09/07/1960   Cancelled Treatment:    Reason Eval/Treat Not Completed: Medical issues which prohibited therapy- patient with no sensation in right LE yet, unable to wiggle toes. Will see tomorrow when appropriate.    Roddrick Sharron 08/09/2018, 3:05 PM
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
## 2018-08-10 LAB — BASIC METABOLIC PANEL
Anion gap: 5 (ref 5–15)
BUN: 12 mg/dL (ref 6–20)
CO2: 25 mmol/L (ref 22–32)
Calcium: 8.6 mg/dL — ABNORMAL LOW (ref 8.9–10.3)
Chloride: 108 mmol/L (ref 98–111)
Creatinine, Ser: 0.52 mg/dL (ref 0.44–1.00)
GFR calc Af Amer: >60 mL/min (ref >60)
GFR calc non Af Amer: >60 mL/min (ref >60)
Glucose, Bld: 117 mg/dL — ABNORMAL HIGH (ref 70–99)
POTASSIUM: 3.9 mmol/L (ref 3.5–5.1)
Sodium: 138 mmol/L (ref 135–145)

## 2018-08-10 LAB — CBC
HCT: 39.9 % (ref 36.0–46.0)
Hemoglobin: 13.4 g/dL (ref 12.0–15.0)
MCH: 30.5 pg (ref 26.0–34.0)
MCHC: 33.6 g/dL (ref 30.0–36.0)
MCV: 90.7 fL (ref 80.0–100.0)
Platelets: 203 10*3/uL (ref 150–400)
RBC: 4.4 MIL/uL (ref 3.87–5.11)
RDW: 11.8 % (ref 11.5–15.5)
WBC: 10.5 10*3/uL (ref 4.0–10.5)
nRBC: 0 % (ref 0.0–0.2)

## 2018-08-10 MED ORDER — IPRATROPIUM BROMIDE 0.02 % IN SOLN: 0.5000 mg | RESPIRATORY_TRACT | Status: DC | PRN

## 2018-08-10 NOTE — [place] FL2 (Signed)
[place] MEDICAID FL2 LEVEL OF CARE SCREENING TOOL     IDENTIFICATION  Patient Name: Annette JeffersonDiane E Evinger Birthdate: 06/30/1961 Sex: female Admission Date (Current Location): 08/09/2018  [place]   Facility and Address:  [facility], [address], [place], [place] [address]      Provider Number: [id number]  Attending Physician Name and Address:  Lyndle HerrlichBowers, James R, MD  Relative Name and Phone Number:       Current Level of Care: Hospital Recommended Level of Care: Skilled Nursing Facility Prior Approval Number:    Date Approved/Denied:   PASRR Number: [id number])  Discharge Plan: SNF    Current Diagnoses: Patient Active Problem List   Diagnosis Date Noted  . Status post right knee replacement 08/09/2018    Orientation RESPIRATION BLADDER Height & Weight     Self, Time, Situation, Place  Normal Continent Weight: 275 lb 2.2 oz (124.8 kg) Height:  5\' 9"  (175.3 cm)  BEHAVIORAL SYMPTOMS/MOOD NEUROLOGICAL BOWEL NUTRITION STATUS      Continent Diet(Diet: Regular )  AMBULATORY STATUS COMMUNICATION OF NEEDS Skin   Extensive Assist Verbally Surgical wounds(Incision: Right Knee. )                       Personal Care Assistance Level of Assistance  Bathing, Feeding, Dressing Bathing Assistance: Limited assistance Feeding assistance: Independent Dressing Assistance: Limited assistance     Functional Limitations Info  Sight, Hearing, Speech Sight Info: Impaired Hearing Info: Adequate Speech Info: Adequate    SPECIAL CARE FACTORS FREQUENCY  PT (By licensed PT), OT (By licensed OT)     PT Frequency: (5) OT Frequency: (5)            Contractures      Additional Factors Info  Code Status, Allergies Code Status Info: (Full Code. ) Allergies Info: (Ibuprofen)           Current Medications (08/10/2018):  This is the current hospital active medication list Current Facility-Administered Medications   Medication Dose Route Frequency Provider Last Rate Last Dose  . acetaminophen (TYLENOL) tablet 325-650 mg  325-650 mg Oral Q6H PRN Lyndle HerrlichBowers, James R, MD      . acetaminophen (TYLENOL) tablet 500 mg  500 mg Oral Q6H Lyndle HerrlichBowers, James R, MD   500 mg at 08/10/18 [phone]  . albuterol (PROVENTIL) (2.5 MG/3ML) 0.083% nebulizer solution 2.5 mg  2.5 mg Nebulization Q6H PRN Lyndle HerrlichBowers, James R, MD      . B-complex with vitamin C tablet 1 tablet  1 tablet Oral Monia PouchQODAY Bowers, James R, MD      . bisacodyl (DULCOLAX) suppository 10 mg  10 mg Rectal Daily PRN Lyndle HerrlichBowers, James R, MD      . cholecalciferol (VITAMIN D3) tablet 1,000 Units  1,000 Units Oral Daily Lyndle HerrlichBowers, James R, MD      . docusate sodium (COLACE) capsule 100 mg  100 mg Oral BID Lyndle HerrlichBowers, James R, MD   100 mg at 08/09/18 1955  . gabapentin (NEURONTIN) capsule 300 mg  300 mg Oral TID Lyndle HerrlichBowers, James R, MD   300 mg at 08/09/18 1955  . HYDROcodone-acetaminophen (NORCO) 7.5-325 MG per tablet 1-2 tablet  1-2 tablet Oral Q4H PRN Lyndle HerrlichBowers, James R, MD   2 tablet at 08/09/18 1829  . HYDROcodone-acetaminophen (NORCO/VICODIN) 5-325 MG per tablet 1-2 tablet  1-2 tablet Oral Q4H PRN Lyndle HerrlichBowers, James R, MD   2 tablet at 08/09/18 1514  . ipratropium (ATROVENT) nebulizer solution 0.5  mg  0.5 mg Nebulization QID Lyndle Herrlich, MD   0.5 mg at 08/10/18 0848  . ketorolac (TORADOL) 15 MG/ML injection 15 mg  15 mg Intravenous Q6H Lyndle Herrlich, MD   15 mg at 08/10/18 1610  . lactated ringers infusion   Intravenous Continuous Lyndle Herrlich, MD 75 mL/hr at 08/09/18 1500    . magnesium citrate solution 1 Bottle  1 Bottle Oral Once PRN Lyndle Herrlich, MD      . magnesium hydroxide (MILK OF MAGNESIA) suspension 30 mL  30 mL Oral Daily PRN Lyndle Herrlich, MD      . menthol-cetylpyridinium (CEPACOL) lozenge 3 mg  1 lozenge Oral PRN Lyndle Herrlich, MD       Or  . phenol (CHLORASEPTIC) mouth spray 1 spray  1 spray Mouth/Throat PRN Lyndle Herrlich, MD      . metoCLOPramide (REGLAN) tablet 5-10  mg  5-10 mg Oral Q8H PRN Lyndle Herrlich, MD       Or  . metoCLOPramide (REGLAN) injection 5-10 mg  5-10 mg Intravenous Q8H PRN Lyndle Herrlich, MD      . mometasone-formoterol [facility]) 200-5 MCG/ACT inhaler 2 puff  2 puff Inhalation BID Lyndle Herrlich, MD   2 puff at 08/09/18 1955  . montelukast (SINGULAIR) tablet 10 mg  10 mg Oral QHS Lyndle Herrlich, MD   10 mg at 08/09/18 1955  . morphine 2 MG/ML injection 0.5-1 mg  0.5-1 mg Intravenous Q2H PRN Lyndle Herrlich, MD   1 mg at 08/09/18 1955  . ondansetron (ZOFRAN) tablet 4 mg  4 mg Oral Q6H PRN Lyndle Herrlich, MD       Or  . ondansetron [facility]) injection 4 mg  4 mg Intravenous Q6H PRN Lyndle Herrlich, MD      . rivaroxaban Carlena Hurl) tablet 10 mg  10 mg Oral Q breakfast Lyndle Herrlich, MD      . sertraline (ZOLOFT) tablet 100 mg  100 mg Oral Daily Lyndle Herrlich, MD      . traMADol Janean Sark) tablet 50 mg  50 mg Oral Q6H Lyndle Herrlich, MD   50 mg at 08/10/18 [phone]  . vitamin C (ASCORBIC ACID) tablet 1,500 mg  1,500 mg Oral Daily Lyndle Herrlich, MD      . zinc sulfate capsule 220 mg  220 mg Oral BID Lyndle Herrlich, MD   220 mg at 08/09/18 1955     Discharge Medications: Please see discharge summary for a list of discharge medications.  Relevant Imaging Results:  Relevant Lab Results:   Additional Information (SSN: [id number])  Tong Pieczynski, Darleen Crocker, LCSW

## 2018-08-10 NOTE — Progress Notes (Signed)
Clinical Social Worker (CSW) received SNF consult. PT is recommending home health. RN case manager aware of above. Please reconsult if future social work needs arise. CSW signing off.   Wade Laysa Kimmey, LCSW [phone] 

## 2018-08-10 NOTE — Progress Notes (Signed)
Physical Therapy Treatment Patient Details Name: Annette Perez MRN: [id number] DOB: Apr 07, 1961 Today's Date: 08/10/2018    History of Present Illness 58 y/o female s/p R TKA 08/09/18.    PT Comments    Pt with independent bed mobility.  Stood and was able to ambulate around unit x 1 with walker and slow but steady gait.  Declined stair training this pm as she was having some increased soreness and due for pain medication shortly.  Will complete stair training in the am.   Follow Up Recommendations  Home health PT     Equipment Recommendations  None recommended by PT    Recommendations for Other Services       Precautions / Restrictions Precautions Precautions: Knee Restrictions Weight Bearing Restrictions: Yes RLE Weight Bearing: Weight bearing as tolerated    Mobility  Bed Mobility Overal bed mobility: Modified Independent                Transfers Overall transfer level: Modified independent Equipment used: Rolling walker (2 wheeled)             General transfer comment: Pt able to rise with good confidence and safety, did not need excessive UE assist  Ambulation/Gait Ambulation/Gait assistance: Min guard Gait Distance (Feet): 180 Feet Assistive device: Rolling walker (2 wheeled) Gait Pattern/deviations: Step-through pattern;Decreased step length - right;Decreased step length - left Gait velocity: decreased   General Gait Details: Pt initially with slow and cautious gait, but with increased cuing and warm up she was able to attain and maintain consistent cadence and walker momentum.     Stairs             Wheelchair Mobility    Modified Rankin (Stroke Patients Only)       Balance Overall balance assessment: Independent                                          Cognition Arousal/Alertness: Awake/alert Behavior During Therapy: WFL for tasks assessed/performed Overall Cognitive Status: Within Functional Limits for tasks  assessed                                        Exercises Total Joint Exercises Ankle Circles/Pumps: 10 reps;Strengthening Quad Sets: Strengthening;10 reps Gluteal Sets: Strengthening;10 reps Heel Slides: Strengthening;10 reps Hip ABduction/ADduction: Strengthening;10 reps Straight Leg Raises: AROM;10 reps Long Arc Quad: Strengthening;10 reps Knee Flexion: PROM;10 reps Goniometric ROM: 0-105 (AROM)    General Comments        Pertinent Vitals/Pain Pain Assessment: 0-10 Pain Score: 5  Pain Location:  R knee Pain Descriptors / Indicators: Aching;Sore Pain Intervention(s): Limited activity within patient's tolerance;Monitored during session;Repositioned    Home Living Family/patient expects to be discharged to:: Private residence Living Arrangements: Spouse/significant other Available Help at Discharge: Family Type of Home: House Home Access: Stairs to enter Entrance Stairs-Rails: (rail on step to deck, no rail to enter home) Home Layout: One level Home Equipment: Environmental consultant - 2 wheels;Bedside commode      Prior Function Level of Independence: Independent      Comments: 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, works as Dentist   PT Goals (current goals can now be found in the care plan section) Acute Rehab PT Goals Patient Stated Goal: go home PT Goal Formulation: With  patient Time For Goal Achievement: 08/24/18 Potential to Achieve Goals: Good Progress towards PT goals: Progressing toward goals    Frequency    BID      PT Plan Current plan remains appropriate    Co-evaluation              AM-PAC PT "6 Clicks" Mobility   Outcome Measure  Help needed turning from your back to your side while in a flat bed without using bedrails?: None Help needed moving from lying on your back to sitting on the side of a flat bed without using bedrails?: None Help needed moving to and from a bed to a chair (including a wheelchair)?: None Help needed standing  up from a chair using your arms (e.g., wheelchair or bedside chair)?: None Help needed to walk in hospital room?: None Help needed climbing 3-5 steps with a railing? : A Little 6 Click Score: 23    End of Session Equipment Utilized During Treatment: Gait belt Activity Tolerance: Patient tolerated treatment well Patient left: in bed;with bed alarm set;with call bell/phone within reach;with family/visitor present Nurse Communication: Mobility status PT Visit Diagnosis: Muscle weakness (generalized) (M62.81);Difficulty in walking, not elsewhere classified (R26.2);Pain Pain - Right/Left: Right Pain - part of body: Knee     Time: 9678-9381 PT Time Calculation (min) (ACUTE ONLY): 9 min  Charges:  $Gait Training: 8-22 mins $Therapeutic Exercise: 8-22 mins                    Danielle Dess, PTA 08/10/18, 2:03 PM

## 2018-08-10 NOTE — Progress Notes (Signed)
  Subjective:  Patient reports pain as moderate.    Objective:   VITALS:   Vitals:   08/09/18 2030 08/09/18 2054 08/09/18 2246 08/10/18 0325  BP: (!) 96/52  112/63 114/60  Pulse: 61  68 62  Resp: 17   16  Temp: 97.8 F (36.6 C)  97.8 F (36.6 C) 98.2 F (36.8 C)  TempSrc: Oral  Oral Oral  SpO2: 96% 96% 94% 94%  Weight:      Height:        PHYSICAL EXAM:  Neurovascular intact Sensation intact distally Dorsiflexion/Plantar flexion intact Incision: dressing C/D/I Compartment soft  LABS  Results for orders placed or performed during the hospital encounter of 08/09/18 (from the past 24 hour(s))  ABO/Rh     Status: None   Collection Time: 08/09/18  8:37 AM  Result Value Ref Range   ABO/RH(D)      A NEG Performed at [facility], [address] [place], [place] [address]   CBC     Status: None   Collection Time: 08/10/18  3:17 AM  Result Value Ref Range   WBC 10.5 4.0 - 10.5 K/uL   RBC 4.40 3.87 - 5.11 MIL/uL   Hemoglobin 13.4 12.0 - 15.0 g/dL   HCT 70.4 88.8 - 91.6 %   MCV 90.7 80.0 - 100.0 fL   MCH 30.5 26.0 - 34.0 pg   MCHC 33.6 30.0 - 36.0 g/dL   RDW 94.5 03.8 - 88.2 %   Platelets 203 150 - 400 K/uL   nRBC 0.0 0.0 - 0.2 %  Basic metabolic panel     Status: Abnormal   Collection Time: 08/10/18  3:17 AM  Result Value Ref Range   Sodium 138 135 - 145 mmol/L   Potassium 3.9 3.5 - 5.1 mmol/L   Chloride 108 98 - 111 mmol/L   CO2 25 22 - 32 mmol/L   Glucose, Bld 117 (H) 70 - 99 mg/dL   BUN 12 6 - 20 mg/dL   Creatinine, Ser 8.00 0.44 - 1.00 mg/dL   Calcium 8.6 (L) 8.9 - 10.3 mg/dL   GFR calc non Af Amer >60 >60 mL/min   GFR calc Af Amer >60 >60 mL/min   Anion gap 5 5 - 15    Dg Knee Right Port  Result Date: 08/09/2018 CLINICAL DATA:  Status post right knee replacement EXAM: PORTABLE RIGHT KNEE - 1-2 VIEW COMPARISON:  None. FINDINGS: Right knee prosthesis is noted in satisfactory position. No acute soft tissue or bony abnormality is seen.  IMPRESSION: Status post right knee replacement. Electronically Signed   By: Alcide Clever M.D.   On: 08/09/2018 13:10    Assessment/Plan: 1 Day Post-Op   Active Problems:   Status post right knee replacement   Up with therapy D/C IV fluids Plan for discharge tomorrow   Lyndle Herrlich , MD 08/10/2018, 8:35 AM

## 2018-08-10 NOTE — Anesthesia Postprocedure Evaluation (Signed)
Anesthesia Post Note  Patient: Annette Perez  Procedure(s) Performed: TOTAL KNEE ARTHROPLASTY (Right )  Anesthesia Type: Spinal     Last Vitals:  Vitals:   08/09/18 2246 08/10/18 0325  BP: 112/63 114/60  Pulse: 68 62  Resp:  16  Temp: 36.6 C 36.8 C  SpO2: 94% 94%    Last Pain:  Vitals:   08/10/18 0325  TempSrc: Oral  PainSc:                  [name]

## 2018-08-10 NOTE — Care Management Note (Signed)
Case Management Note  Patient Details  Name: PREZLEY QADIR MRN: [id number] Date of Birth: Nov 28, 1960  Subjective/Objective:                   Met with the patient and her spouse to discuss DC plan and needs Patient has a RW and a 3 in 1 at home, no further DME needs Patient lives with spouse.  She has transportation bu the spouse is at work during the day Patient has family to call if she has a need Patient provided the [facility] list for choice per CMS.gov Patient has requested to use [facility], notified Corene Cornea Patient uses [facility] [address] in [place] Patient has Dr. Otilio Miu as PCP Patient can afford medications no problem Action/Plan:  [facility] list provided per CMS.gov Patient has chosen [facility] Notified Corene Cornea  Expected Discharge Date:  08/11/18               Expected Discharge Plan:     In-House Referral:     Discharge planning Services  CM Consult  Post Acute Care Choice:    Choice offered to:     DME Arranged:    DME Agency:     HH Arranged:  PT HH Agency:  [facility]  Status of Service:  Completed, signed off  If discussed at [place] of Stay Meetings, dates discussed:    Additional Comments:  Su Hilt, RN 08/10/2018, 2:17 PM

## 2018-08-10 NOTE — Evaluation (Signed)
Physical Therapy Evaluation Patient Details Name: Annette JeffersonDiane E Robar MRN: [id number] DOB: 11/17/1960 Today's Date: 08/10/2018   History of Present Illness  58 y/o female s/p R TKA 08/09/18.  Clinical Impression  Pt did very well with POD1 PT exam and was able to easily ambulate ~100 ft, did SLRs w/o assist, had 0-105 AROM and generally did not have significant limitations due to pain.  She showed great effort with ~10 minutes of exercises.      Follow Up Recommendations Home health PT    Equipment Recommendations  None recommended by PT    Recommendations for Other Services       Precautions / Restrictions Precautions Precautions: Knee Restrictions Weight Bearing Restrictions: Yes RLE Weight Bearing: Weight bearing as tolerated      Mobility  Bed Mobility Overal bed mobility: Modified Independent                Transfers Overall transfer level: Modified independent Equipment used: Rolling walker (2 wheeled)             General transfer comment: Pt able to rise with good confidence and safety, did not need excessive UE assist  Ambulation/Gait Ambulation/Gait assistance: Min guard Gait Distance (Feet): 100 Feet Assistive device: Rolling walker (2 wheeled)       General Gait Details: Pt initially with slow and cautious gait, but with increased cuing and warm up she was able to attain and maintain consistent cadence and walker momentum.    Stairs            Wheelchair Mobility    Modified Rankin (Stroke Patients Only)       Balance Overall balance assessment: Independent                                           Pertinent Vitals/Pain Pain Assessment: (pt with minimal pain at rest, 5/10 during activity)    Home Living Family/patient expects to be discharged to:: Private residence Living Arrangements: Spouse/significant other Available Help at Discharge: Family Type of Home: House Home Access: Stairs to enter Entrance  Stairs-Rails: (rail on step to deck, no rail to enter home) Secretary/administratorntrance Stairs-Number of Steps: 1 + 1 Home Layout: One level Home Equipment: Environmental consultantWalker - 2 wheels;Bedside commode      Prior Function Level of Independence: Independent         Comments: 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, works as Marketing executiveteacher aid     Hand Dominance        Extremity/Trunk Assessment   Upper Extremity Assessment Upper Extremity Assessment: Overall WFL for tasks assessed    Lower Extremity Assessment Lower Extremity Assessment: Overall WFL for tasks assessed;Generalized weakness(expected post-surgical weakness )       Communication   Communication: No difficulties  Cognition Arousal/Alertness: Awake/alert Behavior During Therapy: WFL for tasks assessed/performed Overall Cognitive Status: Within Functional Limits for tasks assessed                                        General Comments      Exercises Total Joint Exercises Ankle Circles/Pumps: 10 reps;Strengthening Quad Sets: Strengthening;10 reps Gluteal Sets: Strengthening;10 reps Heel Slides: Strengthening;10 reps Hip ABduction/ADduction: Strengthening;10 reps Straight Leg Raises: AROM;10 reps Long Arc Quad: Strengthening;10 reps Knee Flexion: PROM;10 reps Goniometric ROM: 0-105 (  AROM)   Assessment/Plan    PT Assessment Patient needs continued PT services  PT Problem List Decreased strength;Decreased range of motion;Decreased activity tolerance;Decreased balance;Decreased mobility;Decreased coordination;Pain;Decreased safety awareness;Decreased knowledge of use of DME       PT Treatment Interventions Gait training;Stair training;Functional mobility training;Balance training;Therapeutic activities;Therapeutic exercise;DME instruction;Neuromuscular re-education;Patient/family education    PT Goals (Current goals can be found in the Care Plan section)  Acute Rehab PT Goals Patient Stated Goal: go home PT Goal Formulation:  With patient Time For Goal Achievement: 08/24/18 Potential to Achieve Goals: Good    Frequency BID   Barriers to discharge        Co-evaluation               AM-PAC PT "6 Clicks" Mobility  Outcome Measure Help needed turning from your back to your side while in a flat bed without using bedrails?: None Help needed moving from lying on your back to sitting on the side of a flat bed without using bedrails?: None Help needed moving to and from a bed to a chair (including a wheelchair)?: None Help needed standing up from a chair using your arms (e.g., wheelchair or bedside chair)?: None Help needed to walk in hospital room?: None Help needed climbing 3-5 steps with a railing? : A Little 6 Click Score: 23    End of Session Equipment Utilized During Treatment: Gait belt Activity Tolerance: Patient tolerated treatment well Patient left: with chair alarm set;with call bell/phone within reach Nurse Communication: Mobility status PT Visit Diagnosis: Muscle weakness (generalized) (M62.81);Difficulty in walking, not elsewhere classified (R26.2);Pain Pain - Right/Left: Right Pain - part of body: Knee    Time: 3903-0092 PT Time Calculation (min) (ACUTE ONLY): 31 min   Charges:   PT Evaluation $PT Eval Low Complexity: 1 Low PT Treatments $Therapeutic Exercise: 8-22 mins        Malachi Pro, DPT 08/10/2018, 11:13 AM

## 2018-08-11 LAB — CBC
HEMATOCRIT: 36.1 % (ref 36.0–46.0)
Hemoglobin: 12.4 g/dL (ref 12.0–15.0)
MCH: 30.2 pg (ref 26.0–34.0)
MCHC: 34.3 g/dL (ref 30.0–36.0)
MCV: 88 fL (ref 80.0–100.0)
Platelets: 206 10*3/uL (ref 150–400)
RBC: 4.1 MIL/uL (ref 3.87–5.11)
RDW: 11.7 % (ref 11.5–15.5)
WBC: 10.4 10*3/uL (ref 4.0–10.5)
nRBC: 0 % (ref 0.0–0.2)

## 2018-08-11 MED ORDER — DOCUSATE SODIUM 100 MG PO CAPS: 100.0000 mg | ORAL_CAPSULE | Freq: Two times a day (BID) | ORAL | 0 refills | Status: DC

## 2018-08-11 MED ORDER — RIVAROXABAN 10 MG PO TABS: 10.0000 mg | ORAL_TABLET | Freq: Every day | ORAL | 0 refills | Status: DC

## 2018-08-11 MED ORDER — HYDROCODONE-ACETAMINOPHEN 5-325 MG PO TABS: 1.0000 | ORAL_TABLET | ORAL | 0 refills | Status: DC | PRN

## 2018-08-11 NOTE — Care Management (Signed)
Barbara Cower with [facility] aware that patient is being discharge to home.

## 2018-08-11 NOTE — Progress Notes (Signed)
Physical Therapy Treatment Patient Details Name: Annette Perez MRN: [id number] DOB: Dec 26, 1960 Today's Date: 08/11/2018    History of Present Illness 58 y/o female s/p R TKA 08/09/18.    PT Comments    Pt with some increased soreness this AM, but still very much able to participate well with PT and showed ability to relatively easily circumambulate the nurses' station with consistent and confidence gait, negotiated up/down steps w/o physical assist and overall showed good strength, ROM and quality of motion.  She had some decrease in ROM this AM (still >90) with c/o soreness and stiffness but continues to have good functional mobility, etc with PT.  Pt ready to go home.  Follow Up Recommendations  Home health PT     Equipment Recommendations  None recommended by PT    Recommendations for Other Services       Precautions / Restrictions Precautions Precautions: Knee Precaution Booklet Issued: (HEP issued) Restrictions RLE Weight Bearing: Weight bearing as tolerated    Mobility  Bed Mobility Overal bed mobility: Modified Independent             General bed mobility comments: pt in recliner on arrival, not formally assessed  Transfers Overall transfer level: Modified independent Equipment used: Rolling walker (2 wheeled)             General transfer comment: Pt again needed only light cuing for set up, showed good awareness, positioning and control in getting back to sitting.    Ambulation/Gait Ambulation/Gait assistance: Supervision Gait Distance (Feet): 250 Feet Assistive device: Rolling walker (2 wheeled)       General Gait Details: Pt did well with ambulation and despite some mild initial hesitation.  Pt with no excessive fatigue, minimally increased pain and no safety issues.    Stairs Stairs: Yes Stairs assistance: Supervision Stair Management: Two rails;No rails Number of Stairs: 4 General stair comments: Pt able to easily negotiate up/down stairs  with step-to pattern using rails.  Also trialed steps w/o rails using RW and backward strategy, pt did well and feels comfortable with this too.   Wheelchair Mobility    Modified Rankin (Stroke Patients Only)       Balance Overall balance assessment: Independent                                          Cognition Arousal/Alertness: Awake/alert Behavior During Therapy: WFL for tasks assessed/performed Overall Cognitive Status: Within Functional Limits for tasks assessed                                        Exercises Total Joint Exercises Ankle Circles/Pumps: Strengthening;15 reps Quad Sets: Strengthening;15 reps Gluteal Sets: Strengthening;10 reps Heel Slides: Strengthening;10 reps Hip ABduction/ADduction: Strengthening;15 reps Straight Leg Raises: AROM;10 reps Long Arc Quad: Strengthening;10 reps Knee Flexion: PROM;5 reps Goniometric ROM: 0-93    General Comments        Pertinent Vitals/Pain Pain Assessment: 0-10 Pain Score: 4 (pain does increase with ROM and quad work) Pain Location:  R knee    Home Living                      Prior Function            PT Goals (current goals can now be  found in the care plan section) Progress towards PT goals: Progressing toward goals    Frequency    BID      PT Plan Current plan remains appropriate    Co-evaluation              AM-PAC PT "6 Clicks" Mobility   Outcome Measure  Help needed turning from your back to your side while in a flat bed without using bedrails?: None Help needed moving from lying on your back to sitting on the side of a flat bed without using bedrails?: None Help needed moving to and from a bed to a chair (including a wheelchair)?: None Help needed standing up from a chair using your arms (e.g., wheelchair or bedside chair)?: None Help needed to walk in hospital room?: None Help needed climbing 3-5 steps with a railing? : None 6 Click  Score: 24    End of Session Equipment Utilized During Treatment: Gait belt Activity Tolerance: Patient tolerated treatment well Patient left: in bed;with bed alarm set;with call bell/phone within reach;with family/visitor present   PT Visit Diagnosis: Muscle weakness (generalized) (M62.81);Difficulty in walking, not elsewhere classified (R26.2);Pain Pain - Right/Left: Right Pain - part of body: Knee     Time: [id number] PT Time Calculation (min) (ACUTE ONLY): 28 min  Charges:  $Gait Training: 8-22 mins $Therapeutic Exercise: 8-22 mins                     Malachi ProGalen R Jemell Town, DPT 08/11/2018, 10:35 AM

## 2018-08-11 NOTE — Discharge Summary (Signed)
Physician Discharge Summary  Patient ID: Annette Perez MRN: [id number] DOB/AGE: Aug 23, 1960 58 y.o.  Admit date: 08/09/2018 Discharge date: 08/11/2018  Admission Diagnoses:  OSTEOARTHRITIS OF RIGHT KNEE <principal problem not specified>  Discharge Diagnoses:  OSTEOARTHRITIS OF RIGHT KNEE Active Problems:   Status post right knee replacement   Past Medical History:  Diagnosis Date  . Asthma   . Depression   . GERD (gastroesophageal reflux disease)    has hiatal hernia surgery   . History of hiatal hernia    repaired  . Mood swings   . Pneumonia    "walking pneumonia when young"    Surgeries: Procedure(s): TOTAL KNEE ARTHROPLASTY on 08/09/2018   Consultants (if any):   Discharged Condition: Improved  Hospital Course: Annette Perez is an 58 y.o. female who was admitted 08/09/2018 with a diagnosis of  OSTEOARTHRITIS OF RIGHT KNEE <principal problem not specified> and went to the operating room on 08/09/2018 and underwent the above named procedures.    She was given perioperative antibiotics:  Anti-infectives (From admission, onward)   Start     Dose/Rate Route Frequency Ordered Stop   08/09/18 1110  50,000 units bacitracin in 0.9% normal saline 250 mL irrigation  Status:  Discontinued       As needed 08/09/18 1110 08/09/18 1252   08/09/18 0600  ceFAZolin (ANCEF) 3 g in dextrose 5 % 50 mL IVPB     3 g 100 mL/hr over 30 Minutes Intravenous On call to O.R. 08/08/18 1950 08/09/18 1046    .  She was given sequential compression devices, early ambulation, and Xarelto for DVT prophylaxis.  She benefited maximally from the hospital stay and there were no complications.    Recent vital signs:  Vitals:   08/10/18 2329 08/11/18 0805  BP: (!) 133/59 (!) 120/57  Pulse: 69 82  Resp: 19 18  Temp: 98.5 F (36.9 C) 98.1 F (36.7 C)  SpO2: 98% 97%    Recent laboratory studies:  Lab Results  Component Value Date   HGB 12.4 08/11/2018   HGB 13.4 08/10/2018   HGB 15.9 (H)  07/26/2018   Lab Results  Component Value Date   WBC 10.4 08/11/2018   PLT 206 08/11/2018   Lab Results  Component Value Date   INR 1.11 07/26/2018   Lab Results  Component Value Date   NA 138 08/10/2018   K 3.9 08/10/2018   CL 108 08/10/2018   CO2 25 08/10/2018   BUN 12 08/10/2018   CREATININE 0.52 08/10/2018   GLUCOSE 117 (H) 08/10/2018    Discharge Medications:   Allergies as of 08/11/2018      Reactions   Ibuprofen Other (See Comments)   Weight loss surgery      Medication List    STOP taking these medications   traMADol 50 MG tablet Commonly known as:  ULTRAM     TAKE these medications   albuterol (2.5 MG/3ML) 0.083% nebulizer solution Commonly known as:  PROVENTIL Take 3 mLs (2.5 mg total) by nebulization every 6 (six) hours as needed for wheezing or shortness of breath.   albuterol 108 (90 Base) MCG/ACT inhaler Commonly known as:  PROVENTIL HFA;VENTOLIN HFA Inhale 1-2 puffs into the lungs every 6 (six) hours as needed for wheezing or shortness of breath.   b complex vitamins tablet Take 1 tablet by mouth every other day.   docusate sodium 100 MG capsule Commonly known as:  COLACE Take 1 capsule (100 mg total) by mouth 2 (two)  times daily.   Fluticasone-Salmeterol 250-50 MCG/DOSE Aepb Commonly known as:  ADVAIR DISKUS INHALE 1 PUFF INTO THE LUNGS TWICE A DAY What changed:    how much to take  how to take this  when to take this   HYDROcodone-acetaminophen 5-325 MG tablet Commonly known as:  NORCO/VICODIN Take 1 tablet by mouth every 4 (four) hours as needed for moderate pain (pain score 4-6).   ipratropium 0.02 % nebulizer solution Commonly known as:  ATROVENT Take 2.5 mLs (0.5 mg total) by nebulization 4 (four) times daily.   montelukast 10 MG tablet Commonly known as:  SINGULAIR TAKE 1 TABLET BY MOUTH EVERYDAY AT BEDTIME   multivitamin tablet Take 1 tablet by mouth daily.   rivaroxaban 10 MG Tabs tablet Commonly known as:   XARELTO Take 1 tablet (10 mg total) by mouth daily with breakfast. Start taking on:  August 12, 2018   sertraline 100 MG tablet Commonly known as:  ZOLOFT TAKE 1 TABLET BY MOUTH EVERY DAY   vitamin C 500 MG tablet Commonly known as:  ASCORBIC ACID Take 1,500 mg by mouth daily.   Vitamin D3 250 MCG (10000 UT) capsule Take 1,000 Units by mouth daily.   zinc gluconate 50 MG tablet Take 50 mg by mouth 2 (two) times daily.       Diagnostic Studies: Dg Knee Right Port  Result Date: 08/09/2018 CLINICAL DATA:  Status post right knee replacement EXAM: PORTABLE RIGHT KNEE - 1-2 VIEW COMPARISON:  None. FINDINGS: Right knee prosthesis is noted in satisfactory position. No acute soft tissue or bony abnormality is seen. IMPRESSION: Status post right knee replacement. Electronically Signed   By: Alcide Clever M.D.   On: 08/09/2018 13:10    Disposition: Discharge disposition: 01-Home or Self Care            Signed: Lyndle Herrlich ,MD 08/11/2018, 9:30 AM

## 2018-08-11 NOTE — Discharge Instructions (Signed)
Continue weight bear as tolerated on the right lower extremity.    Elevate the right lower extremity whenever possible and continue the polar care while elevating the extremity. Patient may shower. No bath or submerging the wound.    Take Xarelto as directed for blood clot prevention.  Continue to work on knee range of motion exercises at home as instructed by physical therapy. Continue to use a walker for assistance with ambulation until cleared by physical therapy.  Call [phone] with any questions, such as fever > 101.5 degrees, drainage from the wound or shortness of breath.  

## 2018-08-11 NOTE — Progress Notes (Signed)
  Subjective:  Patient reports pain as moderate.  Doing well.  Objective:   VITALS:   Vitals:   08/10/18 0848 08/10/18 1159 08/10/18 1652 08/10/18 2329  BP:  (!) 120/51 122/60 (!) 133/59  Pulse:  70 65 69  Resp:    19  Temp:  98 F (36.7 C) 98 F (36.7 C) 98.5 F (36.9 C)  TempSrc:  Oral Oral Oral  SpO2: 95% 98% 98% 98%  Weight:      Height:        PHYSICAL EXAM:  Neurologically intact ABD soft Neurovascular intact Sensation intact distally Intact pulses distally Dorsiflexion/Plantar flexion intact Incision: no drainage  LABS  Results for orders placed or performed during the hospital encounter of 08/09/18 (from the past 24 hour(s))  CBC     Status: None   Collection Time: 08/11/18  3:48 AM  Result Value Ref Range   WBC 10.4 4.0 - 10.5 K/uL   RBC 4.10 3.87 - 5.11 MIL/uL   Hemoglobin 12.4 12.0 - 15.0 g/dL   HCT 84.1 66.0 - 63.0 %   MCV 88.0 80.0 - 100.0 fL   MCH 30.2 26.0 - 34.0 pg   MCHC 34.3 30.0 - 36.0 g/dL   RDW 16.0 10.9 - 32.3 %   Platelets 206 150 - 400 K/uL   nRBC 0.0 0.0 - 0.2 %    Dg Knee Right Port  Result Date: 08/09/2018 CLINICAL DATA:  Status post right knee replacement EXAM: PORTABLE RIGHT KNEE - 1-2 VIEW COMPARISON:  None. FINDINGS: Right knee prosthesis is noted in satisfactory position. No acute soft tissue or bony abnormality is seen. IMPRESSION: Status post right knee replacement. Electronically Signed   By: Alcide Clever M.D.   On: 08/09/2018 13:10    Assessment/Plan: 2 Days Post-Op   Active Problems:   Status post right knee replacement   Advance diet D/C IV fluids Discharge home with home health   Altamese Cabal , PA-C 08/11/2018, 6:37 AM

## 2018-08-14 NOTE — Care Management (Addendum)
Post discharge note entry: RNCM left message for [facility] CMA with Dr. Odis Luster requesting follow up for home health PT orders. Update at 1401: [facility] has not returned my call. RNCM spoke with patient and she has not heard anything either however [facility] reached out to her stating they were still waiting on orders from surgeon. RNCM reached back to Melissa and Melissa states that "it has already been taken care of with [facility]".  RNCM explained that patient preferred [facility] and request that order be changed to [facility]. Melissa agreed.

## 2018-08-20 ENCOUNTER — Other Ambulatory Visit: Payer: Self-pay | Admitting: Family Medicine

## 2018-08-20 DIAGNOSIS — J4521 Mild intermittent asthma with (acute) exacerbation: Secondary | ICD-10-CM

## 2018-08-30 ENCOUNTER — Other Ambulatory Visit: Payer: Self-pay | Admitting: Family Medicine

## 2018-08-30 DIAGNOSIS — F329 Major depressive disorder, single episode, unspecified: Secondary | ICD-10-CM

## 2018-11-10 ENCOUNTER — Other Ambulatory Visit: Payer: Self-pay | Admitting: Family Medicine

## 2018-11-25 ENCOUNTER — Other Ambulatory Visit: Payer: Self-pay | Admitting: Family Medicine

## 2018-11-25 DIAGNOSIS — F329 Major depressive disorder, single episode, unspecified: Secondary | ICD-10-CM

## 2018-12-05 ENCOUNTER — Other Ambulatory Visit: Payer: Self-pay | Admitting: Family Medicine

## 2019-01-18 ENCOUNTER — Other Ambulatory Visit: Payer: Self-pay | Admitting: Family Medicine

## 2019-01-18 DIAGNOSIS — J4521 Mild intermittent asthma with (acute) exacerbation: Secondary | ICD-10-CM

## 2019-01-18 DIAGNOSIS — J40 Bronchitis, not specified as acute or chronic: Secondary | ICD-10-CM

## 2019-01-31 ENCOUNTER — Other Ambulatory Visit: Payer: Self-pay | Admitting: Family Medicine

## 2019-01-31 DIAGNOSIS — Z20822 Contact with and (suspected) exposure to covid-19: Secondary | ICD-10-CM

## 2019-02-01 LAB — NOVEL CORONAVIRUS, NAA: SARS-CoV-2, NAA: NOT DETECTED

## 2019-02-12 ENCOUNTER — Other Ambulatory Visit: Payer: Self-pay | Admitting: Family Medicine

## 2019-02-12 DIAGNOSIS — F329 Major depressive disorder, single episode, unspecified: Secondary | ICD-10-CM

## 2019-02-12 DIAGNOSIS — J4521 Mild intermittent asthma with (acute) exacerbation: Secondary | ICD-10-CM

## 2019-02-15 ENCOUNTER — Ambulatory Visit: Payer: BC Managed Care – PPO | Admitting: Family Medicine

## 2019-02-15 ENCOUNTER — Ambulatory Visit
Admission: RE | Admit: 2019-02-15 | Discharge: 2019-02-15 | Disposition: A | Discharge status: Home / Self Care | Payer: BC Managed Care – PPO | Attending: Family Medicine | Admitting: Family Medicine

## 2019-02-15 ENCOUNTER — Other Ambulatory Visit: Payer: Self-pay

## 2019-02-15 ENCOUNTER — Ambulatory Visit
Admission: RE | Admit: 2019-02-15 | Discharge: 2019-02-15 | Disposition: A | Discharge status: Home / Self Care | Payer: BC Managed Care – PPO | Source: Ambulatory Visit | Attending: Family Medicine | Admitting: Family Medicine

## 2019-02-15 ENCOUNTER — Encounter: Payer: Self-pay | Admitting: Family Medicine

## 2019-02-15 VITALS — BP 110/80 | HR 64 | Ht 69.0 in | Wt 278.0 lb

## 2019-02-15 DIAGNOSIS — J449 Chronic obstructive pulmonary disease, unspecified: Secondary | ICD-10-CM | POA: Diagnosis not present

## 2019-02-15 DIAGNOSIS — J4520 Mild intermittent asthma, uncomplicated: Secondary | ICD-10-CM

## 2019-02-15 DIAGNOSIS — R9389 Abnormal findings on diagnostic imaging of other specified body structures: Secondary | ICD-10-CM

## 2019-02-15 DIAGNOSIS — F329 Major depressive disorder, single episode, unspecified: Secondary | ICD-10-CM

## 2019-02-15 MED ORDER — SERTRALINE HCL 100 MG PO TABS: 100.0000 mg | ORAL_TABLET | Freq: Every day | ORAL | 1 refills | Status: DC

## 2019-02-15 MED ORDER — FLUTICASONE-SALMETEROL 250-50 MCG/DOSE IN AEPB: INHALATION_SPRAY | RESPIRATORY_TRACT | 0 refills | Status: DC

## 2019-02-15 MED ORDER — ALBUTEROL SULFATE HFA 108 (90 BASE) MCG/ACT IN AERS: INHALATION_SPRAY | RESPIRATORY_TRACT | 11 refills | Status: DC

## 2019-02-15 MED ORDER — MONTELUKAST SODIUM 10 MG PO TABS: ORAL_TABLET | ORAL | 1 refills | Status: DC
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
## 2019-04-10 ENCOUNTER — Ambulatory Visit: Payer: BC Managed Care – PPO | Admitting: Family Medicine

## 2019-04-10 ENCOUNTER — Telehealth: Payer: Self-pay

## 2019-04-10 NOTE — Telephone Encounter (Signed)
Called pt to ask about cat bite. She said it occurred Thursday or Friday of last week. Started looking red over the weekend and then became sore. It IS HER cat. Now the redness is gone and not sore anymore. She is to continue washing with antibacterial soap bid and apply neosporin on cut. She is to call on Thursday if she hasn't seen significant improvement.

## 2019-04-13 ENCOUNTER — Encounter: Payer: Self-pay | Admitting: Family Medicine

## 2019-04-13 ENCOUNTER — Other Ambulatory Visit: Payer: Self-pay

## 2019-04-13 ENCOUNTER — Ambulatory Visit: Payer: BC Managed Care – PPO | Admitting: Family Medicine

## 2019-04-13 VITALS — BP 130/70 | HR 80 | Ht 69.0 in | Wt 280.0 lb

## 2019-04-13 DIAGNOSIS — W5501XA Bitten by cat, initial encounter: Secondary | ICD-10-CM

## 2019-04-13 DIAGNOSIS — L03114 Cellulitis of left upper limb: Secondary | ICD-10-CM

## 2019-04-13 MED ORDER — AMOXICILLIN-POT CLAVULANATE 875-125 MG PO TABS: 1.0000 | ORAL_TABLET | Freq: Two times a day (BID) | ORAL | 0 refills | Status: DC

## 2019-04-13 MED ORDER — MUPIROCIN 2 % EX OINT: 1.0000 "application " | TOPICAL_OINTMENT | Freq: Two times a day (BID) | CUTANEOUS | 0 refills | Status: DC
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
## 2019-04-13 NOTE — Patient Instructions (Signed)
Pasteurella Multocida Infection Pasteurella multocida is a type of bacteria that can cause a skin infection. The skin infection may spread into the joints, bones, and tissues that connect muscles to bones (tendons). The infection may also spread to:  The surface of the brain (meningitis).  The blood. This is rare. If the infection does spread to your blood, you may develop a heart infection (endocarditis). Infection with Pasteurella multocida can be treated with antibiotic medicine. It is important to get treatment as soon as possible so that more serious symptoms or conditions do not develop. This condition cannot spread from person to person (is not contagious). What are the causes? This infection is caused by the Pasteurella multocida bacteria. You may become infected through a bite or a scratch from an animal that carries the bacteria in its saliva. You may also become infected after an infected animal licks an open skin wound (like a cut or scratch) or licks near your eyes, nose, or mouth. The following animals can carry the bacteria:  Cats and dogs. Most cats and many dogs have the bacteria in their mouths.  Poultry, such as Programme researcher, broadcasting/film/video and turkeys.  Livestock, such as cows, horses, and sheep. What increases the risk? This condition is more likely to develop in people who:  Live with a dog or cat.  Work with live Sales executive.  Have a weak disease-fighting system (immune system).  Have a sore or an open wound.  Have liver disease.  Have a respiratory illness. What are the signs or symptoms? Symptoms usually start within 24 hours after contact with an infected animal. Symptoms include:  Pain, redness, warmth, and swelling (cellulitis) around the bite, cut, or scratch.  Swollen glands near the bite, cut, or scratch.  Fluid leaking from the bite, cut, or scratch area.  Fever. Symptoms of a more complicated disease may develop later. These may include:  Joint pain.  This can make it hard for you to move.  Bone pain. How is this diagnosed? This condition may be diagnosed based on:  Your symptoms and medical history.  A physical exam.  Removal of fluid samples from a wound or a joint, to be tested for bacteria.  Blood tests.  Imaging tests, such as CT scan or MRI. How is this treated? This condition is treated with antibiotic medicines. These medicines may be given by mouth (orally) or through an IV at the hospital, depending on the severity and location of your infection. You may also need a tetanus shot to help prevent further infection. If you have a wound, such as a bite or scratch, your health care provider may cover it with a bandage (dressing). Follow these instructions at home: Medicines  Take your antibiotic medicine as told by your health care provider. Do not stop taking the antibiotic even if you start to feel better.  Take other over-the-counter and prescription medicines only as told by your health care provider. Wound care  If you have a wound, follow instructions from your health care provider about how to take care of your wound. Make sure you: ? Wash your hands with soap and water before you change your dressing. If soap and water are not available, use hand sanitizer. ? Change your dressing as told by your health care provider.  Check your wound every day for signs that the infection is getting worse. Check for: ? More redness, swelling, or pain. ? Fluid or blood. ? Warmth. ? Pus or a bad smell. General instructions  Rest and return to your normal activities as told by your health care provider. Ask your health care provider what activities are safe for you.  To prevent future infections: ? Avoid close contact with animals, including pets, especially when you have open wounds. ? Wash your hands with soap and water after every time you have contact with an animal.  Keep all follow-up visits as told by your health care  provider. This is important. Contact a health care provider if you:  Received a tetanus shot during treatment and you have any of the following at your injection site: ? Swelling. ? Severe pain. ? Redness. ? Bleeding.  Have any of the following: ? More redness, swelling, or pain around your wound. ? More fluid or blood coming from your wound. ? Pus or a bad smell coming from your wound. ? Trouble moving the affected area. ? Swollen joints.  Notice that your wound feels warm to the touch. Get help right away if you have:  A bad headache.  A stiff neck.  Chest pain.  Difficulty breathing. Summary  Pasteurella multocida is a type of bacteria that can cause a skin infection. The skin infection may spread into the joints, bones, and tissues that connect muscles to bones (tendons).  You may become infected through a bite or a scratch from an animal that carries the bacteria in its saliva.  This condition is treated with antibiotic medicines.  Take your antibiotic medicine as told by your health care provider. Do not stop taking the antibiotic even if you start to feel better.  If you have a wound, follow instructions from your health care provider about how to take care of your wound. This information is not intended to replace advice given to you by your health care provider. Make sure you discuss any questions you have with your health care provider. Document Released: 03/09/2011 Document Revised: 08/01/2018 Document Reviewed: 05/04/2017 Elsevier Patient Education  2020 [employer].

## 2019-04-20 ENCOUNTER — Other Ambulatory Visit: Payer: Self-pay

## 2019-04-20 DIAGNOSIS — Z20822 Contact with and (suspected) exposure to covid-19: Secondary | ICD-10-CM

## 2019-04-22 LAB — NOVEL CORONAVIRUS, NAA: SARS-CoV-2, NAA: NOT DETECTED

## 2019-06-28 ENCOUNTER — Other Ambulatory Visit: Payer: BC Managed Care – PPO

## 2019-06-28 ENCOUNTER — Other Ambulatory Visit: Admission: RE | Admit: 2019-06-28 | Payer: BC Managed Care – PPO | Source: Ambulatory Visit

## 2019-06-28 LAB — HM COLONOSCOPY

## 2019-07-06 HISTORY — PX: REPLACEMENT TOTAL KNEE: SUR1224

## 2019-08-02 IMAGING — CR DG CHEST 2V
2 series · 2 of 2 positions shown · non-contrast
Comparison: None.

CLINICAL DATA: Cough and congestion

EXAM:
CHEST - 2 VIEW

[chest pa]
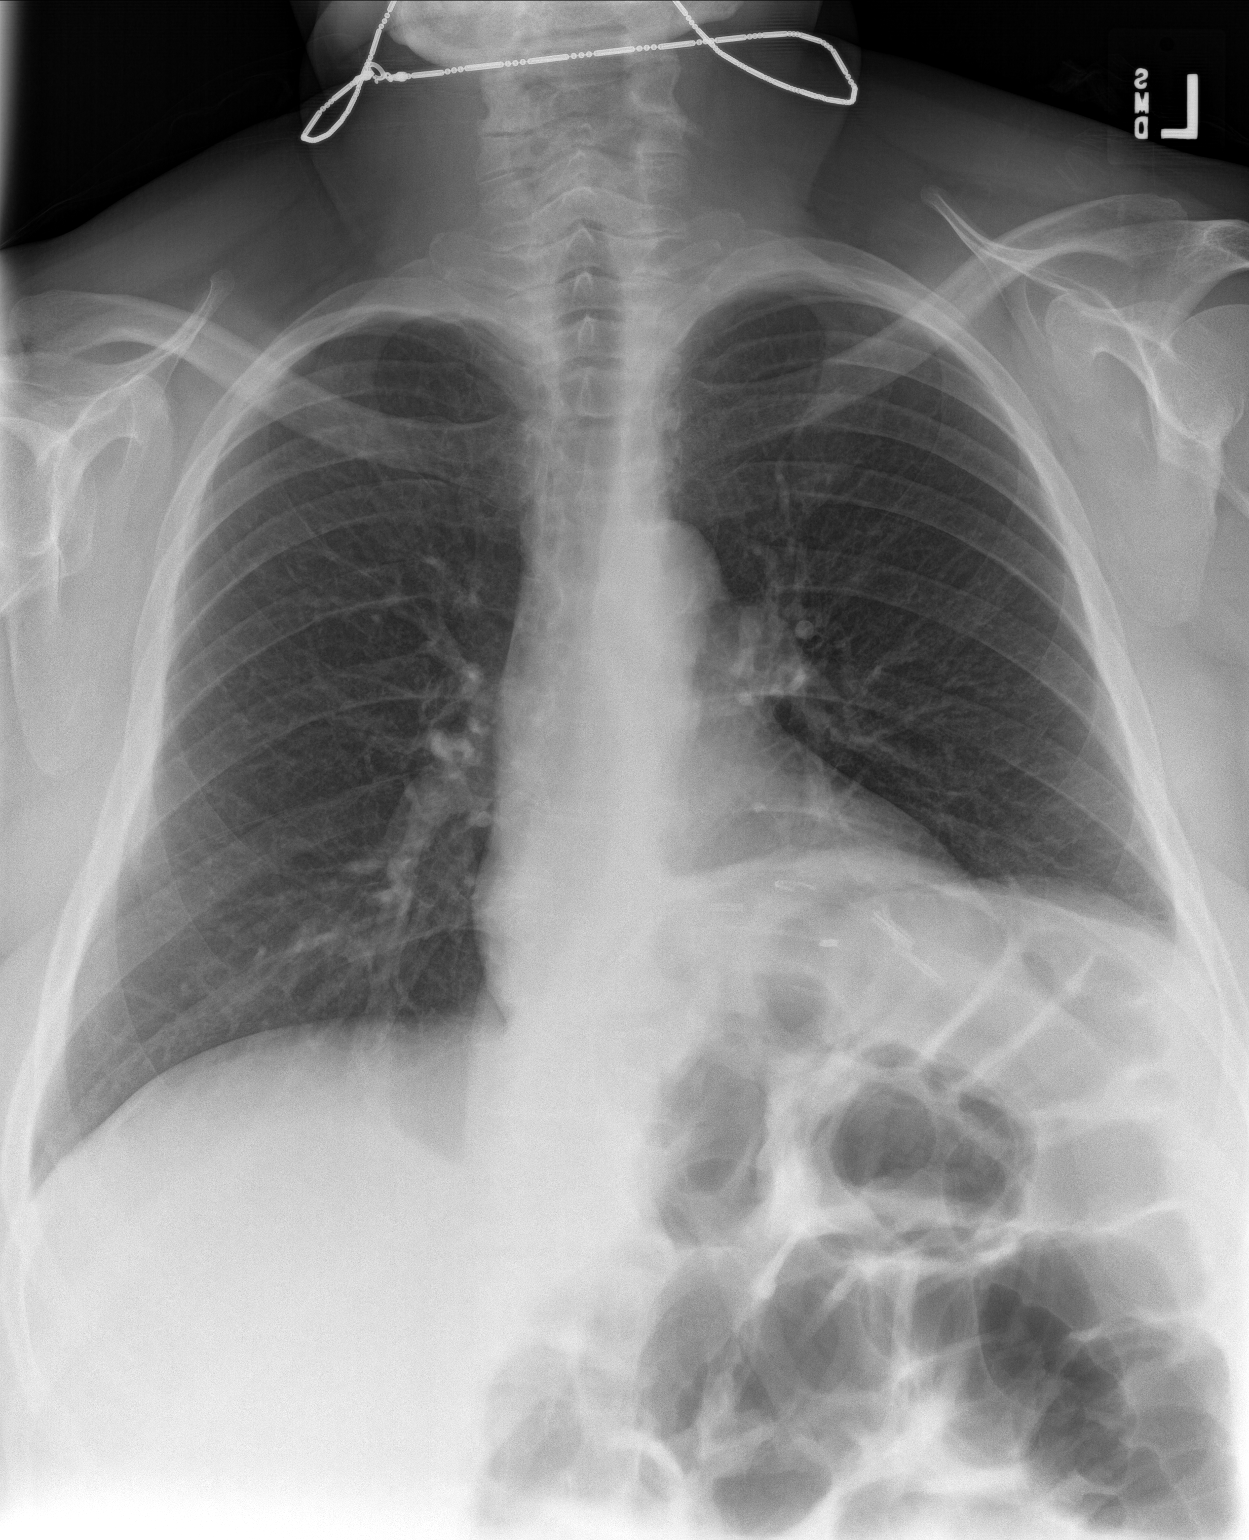

[chest lat]
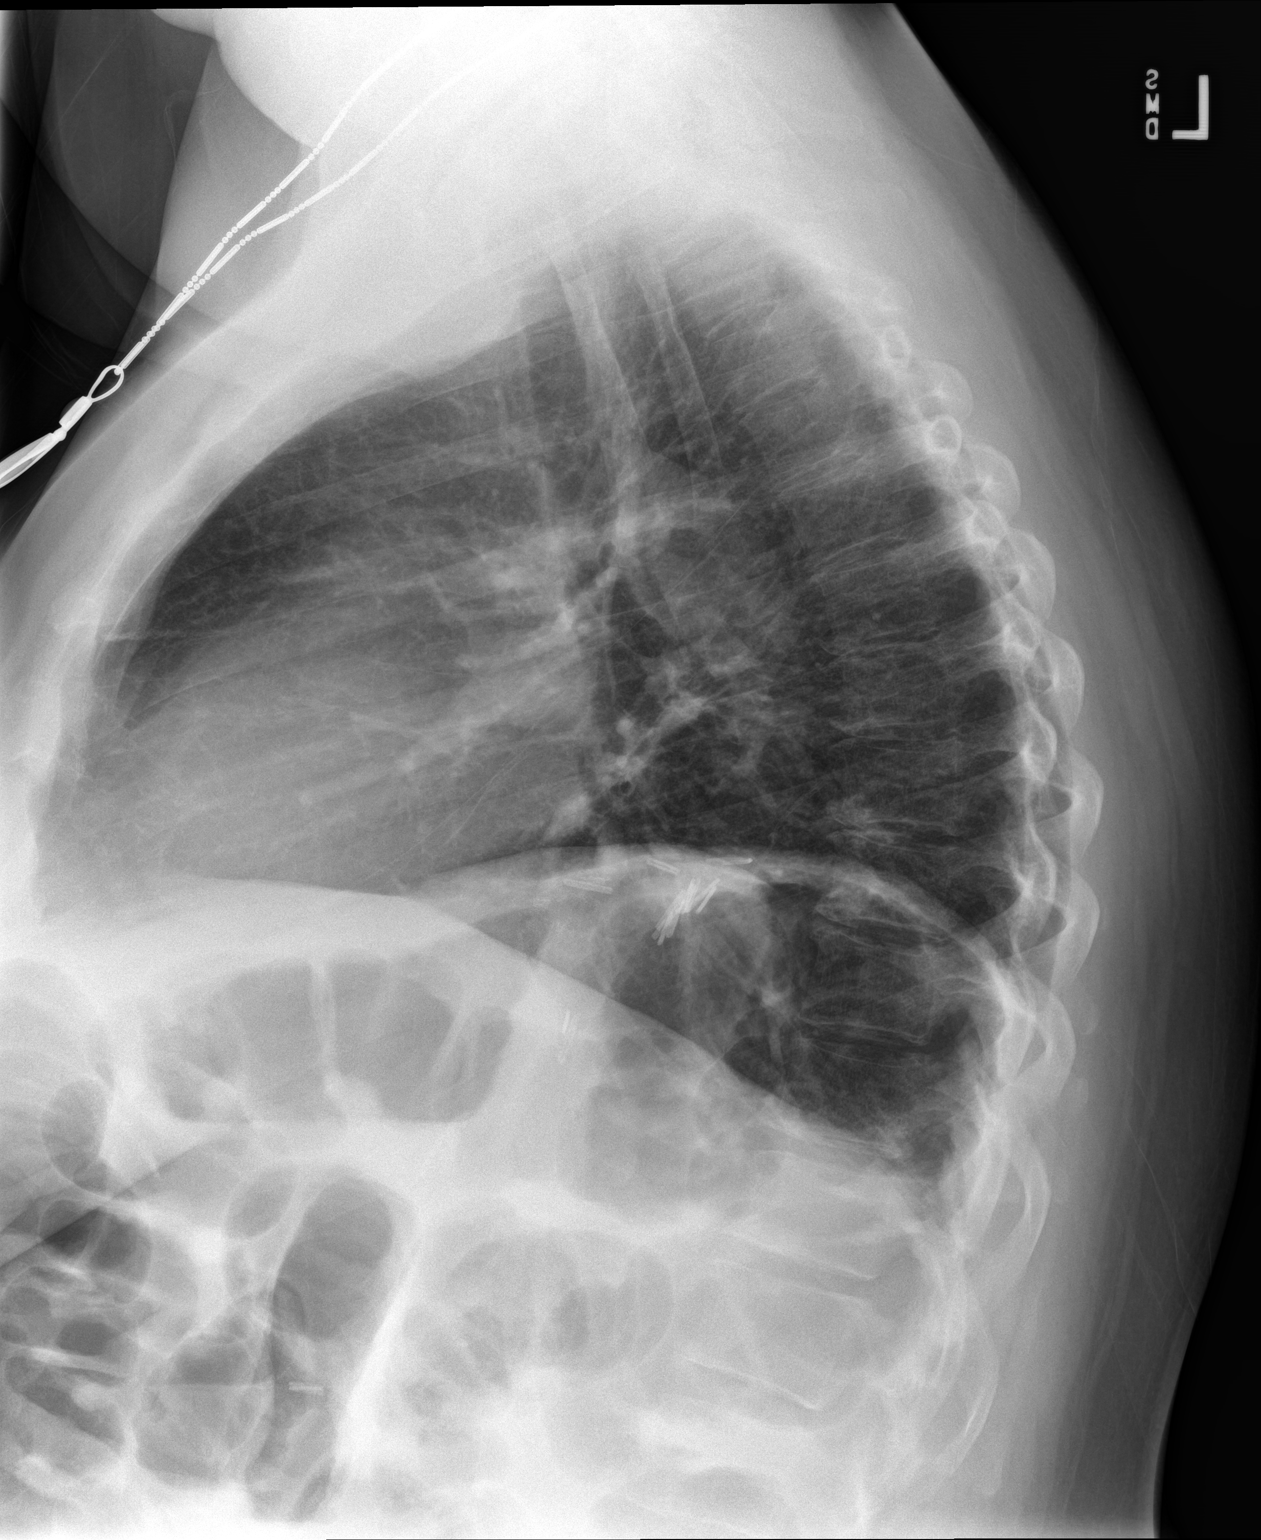

[2 of 2 positions shown; findings below may reference images not displayed]

FINDINGS: There is mild scarring in the left base region with mild elevation
of the left hemidiaphragm. No edema or consolidation. The heart size
and pulmonary vascularity are normal. No adenopathy. There is aortic
atherosclerosis. There is mild degenerative change in thoracic
spine.
IMPRESSION: Mild scarring left base. No edema or consolidation. Heart size
normal. There is aortic atherosclerosis.

Aortic Atherosclerosis (P03YG-XZ7.7).

## 2019-08-18 ENCOUNTER — Other Ambulatory Visit: Payer: Self-pay | Admitting: Family Medicine

## 2019-08-18 DIAGNOSIS — J4520 Mild intermittent asthma, uncomplicated: Secondary | ICD-10-CM

## 2019-09-05 ENCOUNTER — Other Ambulatory Visit: Payer: Self-pay | Admitting: Family Medicine

## 2019-09-05 DIAGNOSIS — F329 Major depressive disorder, single episode, unspecified: Secondary | ICD-10-CM

## 2019-10-07 ENCOUNTER — Other Ambulatory Visit: Payer: Self-pay | Admitting: Family Medicine

## 2019-10-07 DIAGNOSIS — F329 Major depressive disorder, single episode, unspecified: Secondary | ICD-10-CM

## 2019-10-11 ENCOUNTER — Other Ambulatory Visit
Admission: RE | Admit: 2019-10-11 | Discharge: 2019-10-11 | Disposition: A | Discharge status: Home / Self Care | Payer: BC Managed Care – PPO | Attending: Family Medicine | Admitting: Family Medicine

## 2019-10-11 ENCOUNTER — Ambulatory Visit (INDEPENDENT_AMBULATORY_CARE_PROVIDER_SITE_OTHER): Payer: BC Managed Care – PPO | Admitting: Family Medicine

## 2019-10-11 ENCOUNTER — Encounter: Payer: Self-pay | Admitting: Family Medicine

## 2019-10-11 ENCOUNTER — Other Ambulatory Visit: Payer: Self-pay

## 2019-10-11 VITALS — BP 120/80 | HR 60 | Ht 69.0 in | Wt 275.0 lb

## 2019-10-11 DIAGNOSIS — J301 Allergic rhinitis due to pollen: Secondary | ICD-10-CM

## 2019-10-11 DIAGNOSIS — Z862 Personal history of diseases of the blood and blood-forming organs and certain disorders involving the immune mechanism: Secondary | ICD-10-CM

## 2019-10-11 DIAGNOSIS — Z9884 Bariatric surgery status: Secondary | ICD-10-CM | POA: Insufficient documentation

## 2019-10-11 DIAGNOSIS — F329 Major depressive disorder, single episode, unspecified: Secondary | ICD-10-CM

## 2019-10-11 DIAGNOSIS — I700 Atherosclerosis of aorta: Secondary | ICD-10-CM | POA: Insufficient documentation

## 2019-10-11 DIAGNOSIS — J4520 Mild intermittent asthma, uncomplicated: Secondary | ICD-10-CM | POA: Diagnosis not present

## 2019-10-11 DIAGNOSIS — E669 Obesity, unspecified: Secondary | ICD-10-CM

## 2019-10-11 DIAGNOSIS — J449 Chronic obstructive pulmonary disease, unspecified: Secondary | ICD-10-CM

## 2019-10-11 LAB — CBC WITH DIFFERENTIAL/PLATELET
Abs Immature Granulocytes: 0.02 10*3/uL (ref 0.00–0.07)
Basophils Absolute: 0.1 10*3/uL (ref 0.0–0.1)
Basophils Relative: 1 %
Eosinophils Absolute: 0.1 10*3/uL (ref 0.0–0.5)
Eosinophils Relative: 2 %
HCT: 45.6 % (ref 36.0–46.0)
Hemoglobin: 15.7 g/dL — ABNORMAL HIGH (ref 12.0–15.0)
Immature Granulocytes: 0 %
Lymphocytes Relative: 24 %
Lymphs Abs: 1.9 10*3/uL (ref 0.7–4.0)
MCH: 31 pg (ref 26.0–34.0)
MCHC: 34.4 g/dL (ref 30.0–36.0)
MCV: 90.1 fL (ref 80.0–100.0)
Monocytes Absolute: 0.5 10*3/uL (ref 0.1–1.0)
Monocytes Relative: 7 %
Neutro Abs: 5.3 10*3/uL (ref 1.7–7.7)
Neutrophils Relative %: 66 %
Platelets: 227 10*3/uL (ref 150–400)
RBC: 5.06 MIL/uL (ref 3.87–5.11)
RDW: 12.2 % (ref 11.5–15.5)
WBC: 7.9 10*3/uL (ref 4.0–10.5)
nRBC: 0 % (ref 0.0–0.2)

## 2019-10-11 LAB — BASIC METABOLIC PANEL
Anion gap: 6 (ref 5–15)
BUN: 12 mg/dL (ref 6–20)
CO2: 26 mmol/L (ref 22–32)
Calcium: 8.8 mg/dL — ABNORMAL LOW (ref 8.9–10.3)
Chloride: 106 mmol/L (ref 98–111)
Creatinine, Ser: 0.65 mg/dL (ref 0.44–1.00)
GFR calc Af Amer: >60 mL/min (ref >60)
GFR calc non Af Amer: >60 mL/min (ref >60)
Glucose, Bld: 94 mg/dL (ref 70–99)
Potassium: 4.5 mmol/L (ref 3.5–5.1)
Sodium: 138 mmol/L (ref 135–145)

## 2019-10-11 LAB — LIPID PANEL
Cholesterol: 111 mg/dL (ref 0–200)
HDL: 30 mg/dL — ABNORMAL LOW (ref >40)
LDL Cholesterol: 68 mg/dL (ref 0–99)
Total CHOL/HDL Ratio: 3.7 RATIO
Triglycerides: 66 mg/dL (ref <150)
VLDL: 13 mg/dL (ref 0–40)

## 2019-10-11 LAB — FERRITIN: Ferritin: 100 ng/mL (ref 11–307)

## 2019-10-11 LAB — VITAMIN D 25 HYDROXY (VIT D DEFICIENCY, FRACTURES): Vit D, 25-Hydroxy: 42.15 ng/mL (ref 30–100)

## 2019-10-11 MED ORDER — SERTRALINE HCL 100 MG PO TABS: 100.0000 mg | ORAL_TABLET | Freq: Every day | ORAL | 1 refills | Status: DC

## 2019-10-11 MED ORDER — MONTELUKAST SODIUM 10 MG PO TABS: ORAL_TABLET | ORAL | 3 refills | Status: DC

## 2019-10-11 NOTE — Patient Instructions (Signed)
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
## 2019-10-14 ENCOUNTER — Other Ambulatory Visit: Payer: Self-pay | Admitting: Family Medicine

## 2019-10-14 DIAGNOSIS — J4520 Mild intermittent asthma, uncomplicated: Secondary | ICD-10-CM

## 2020-01-06 ENCOUNTER — Other Ambulatory Visit: Payer: Self-pay | Admitting: Family Medicine

## 2020-01-06 DIAGNOSIS — J4520 Mild intermittent asthma, uncomplicated: Secondary | ICD-10-CM

## 2020-01-06 NOTE — Telephone Encounter (Signed)
Requested Prescriptions  Pending Prescriptions Disp Refills  . WIXELA INHUB 250-50 MCG/DOSE AEPB [Pharmacy Med Name: WIXELA 250-50 INHUB] 180 each 0    Sig: INHALE 1 PUFF INTO THE LUNGS TWICE A DAY     Pulmonology:  Combination Products Passed - 01/06/2020  9:29 AM      Passed - Valid encounter within last 12 months    Recent Outpatient Visits          2 months ago Mild intermittent asthma without complication   [facility] Duanne Limerick, MD   8 months ago Cellulitis of left upper extremity   [facility] Duanne Limerick, MD   10 months ago Mild intermittent asthma without complication   [facility] Duanne Limerick, MD   1 year ago Pre-operative cardiovascular examination   [facility] Duanne Limerick, MD   1 year ago Acute maxillary sinusitis, recurrence not specified   [facility] Duanne Limerick, MD

## 2020-04-07 ENCOUNTER — Other Ambulatory Visit: Payer: Self-pay | Admitting: Family Medicine

## 2020-04-07 DIAGNOSIS — J4520 Mild intermittent asthma, uncomplicated: Secondary | ICD-10-CM

## 2020-04-08 ENCOUNTER — Telehealth: Payer: Self-pay | Admitting: Family Medicine

## 2020-04-08 DIAGNOSIS — F329 Major depressive disorder, single episode, unspecified: Secondary | ICD-10-CM

## 2020-04-08 NOTE — Telephone Encounter (Signed)
Requested Prescriptions  Pending Prescriptions Disp Refills   sertraline (ZOLOFT) 100 MG tablet [Pharmacy Med Name: SERTRALINE HCL 100 MG TABLET] 30 tablet 0    Sig: TAKE 1 TABLET BY MOUTH EVERY DAY     Psychiatry:  Antidepressants - SSRI Passed - 04/08/2020  1:29 AM      Passed - Valid encounter within last 6 months    Recent Outpatient Visits          6 months ago Mild intermittent asthma without complication   [facility] Duanne Limerick, MD   12 months ago Cellulitis of left upper extremity   [facility] Duanne Limerick, MD   1 year ago Mild intermittent asthma without complication   [facility] Duanne Limerick, MD   1 year ago Pre-operative cardiovascular examination   [facility] Duanne Limerick, MD   1 year ago Acute maxillary sinusitis, recurrence not specified   [facility] Duanne Limerick, MD             One month courtesy refill provided per protocol.  Patient is due for a 6 month follow-up appointment.

## 2020-05-02 ENCOUNTER — Other Ambulatory Visit: Payer: Self-pay | Admitting: Family Medicine

## 2020-05-02 DIAGNOSIS — F329 Major depressive disorder, single episode, unspecified: Secondary | ICD-10-CM

## 2020-05-02 NOTE — Telephone Encounter (Signed)
Requested medications are due for refill today yes  Requested medications are on the active medication list yes  Last refill 10/5    Future visit scheduled no  Notes to clinic Has already had a curtesy refill, no upcoming appt. Scheduled.

## 2020-05-07 ENCOUNTER — Encounter: Payer: Self-pay | Admitting: Family Medicine

## 2020-05-07 ENCOUNTER — Ambulatory Visit: Payer: BC Managed Care – PPO | Admitting: Family Medicine

## 2020-05-07 ENCOUNTER — Other Ambulatory Visit: Payer: Self-pay

## 2020-05-07 DIAGNOSIS — J4520 Mild intermittent asthma, uncomplicated: Secondary | ICD-10-CM | POA: Diagnosis not present

## 2020-05-07 DIAGNOSIS — J301 Allergic rhinitis due to pollen: Secondary | ICD-10-CM | POA: Diagnosis not present

## 2020-05-07 DIAGNOSIS — F329 Major depressive disorder, single episode, unspecified: Secondary | ICD-10-CM | POA: Diagnosis not present

## 2020-05-07 DIAGNOSIS — J4521 Mild intermittent asthma with (acute) exacerbation: Secondary | ICD-10-CM | POA: Diagnosis not present

## 2020-05-07 MED ORDER — MONTELUKAST SODIUM 10 MG PO TABS: ORAL_TABLET | ORAL | 1 refills | Status: DC

## 2020-05-07 MED ORDER — ALBUTEROL SULFATE HFA 108 (90 BASE) MCG/ACT IN AERS: INHALATION_SPRAY | RESPIRATORY_TRACT | 11 refills | Status: DC

## 2020-05-07 MED ORDER — FLUTICASONE-SALMETEROL 250-50 MCG/DOSE IN AEPB: INHALATION_SPRAY | RESPIRATORY_TRACT | 1 refills | Status: DC

## 2020-05-07 MED ORDER — IPRATROPIUM BROMIDE 0.02 % IN SOLN: 0.5000 mg | Freq: Four times a day (QID) | RESPIRATORY_TRACT | 1 refills | Status: DC

## 2020-05-07 MED ORDER — SERTRALINE HCL 100 MG PO TABS: 100.0000 mg | ORAL_TABLET | Freq: Every day | ORAL | 1 refills | Status: DC
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
## 2020-07-17 ENCOUNTER — Other Ambulatory Visit: Payer: Self-pay | Admitting: Family Medicine

## 2020-07-17 ENCOUNTER — Other Ambulatory Visit: Payer: Self-pay

## 2020-07-17 ENCOUNTER — Ambulatory Visit (INDEPENDENT_AMBULATORY_CARE_PROVIDER_SITE_OTHER): Payer: BC Managed Care – PPO | Admitting: Family Medicine

## 2020-07-17 ENCOUNTER — Encounter: Payer: Self-pay | Admitting: Family Medicine

## 2020-07-17 VITALS — BP 120/80 | HR 68 | Ht 67.0 in | Wt 264.0 lb

## 2020-07-17 DIAGNOSIS — J0100 Acute maxillary sinusitis, unspecified: Secondary | ICD-10-CM

## 2020-07-17 DIAGNOSIS — R059 Cough, unspecified: Secondary | ICD-10-CM | POA: Diagnosis not present

## 2020-07-17 DIAGNOSIS — R69 Illness, unspecified: Secondary | ICD-10-CM | POA: Diagnosis not present

## 2020-07-17 MED ORDER — AMOXICILLIN-POT CLAVULANATE 875-125 MG PO TABS: 1.0000 | ORAL_TABLET | Freq: Two times a day (BID) | ORAL | 0 refills | Status: DC

## 2020-07-17 MED ORDER — FLUCONAZOLE 150 MG PO TABS: 150.0000 mg | ORAL_TABLET | Freq: Once | ORAL | 0 refills | Status: AC

## 2020-07-17 MED ORDER — GUAIFENESIN-CODEINE 100-10 MG/5ML PO SYRP: 5.0000 mL | ORAL_SOLUTION | Freq: Four times a day (QID) | ORAL | 0 refills | Status: DC | PRN

## 2020-07-17 MED ORDER — FLUCONAZOLE 150 MG PO TABS: 150.0000 mg | ORAL_TABLET | Freq: Once | ORAL | 0 refills | Status: DC
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
## 2020-07-17 NOTE — Telephone Encounter (Signed)
Medication Refill - Medication: amoxicillin-clavulanate (AUGMENTIN) 875-125 MG tablet fluconazole (DIFLUCAN) 150 MG tablet guaiFENesin-codeine (ROBITUSSIN AC) 100-10 MG/5ML syrup     Preferred Pharmacy (with phone number or street name):  [id number] [place], [place] - [address] Phone:  [phone]  Fax:  [phone]       Agent: Please be advised that RX refills may take up to 3 business days. We ask that you follow-up with your pharmacy.

## 2020-07-17 NOTE — Telephone Encounter (Signed)
Notes to clinic:  Dr. Yetta Barre ordered Guaifenesin-Codeine today; pt. Called inquiring about meds.  Noted that the Rx did not send electronically.  Phoned in the order to [facility].  Please forward to Dr. Yetta Barre to sign order due to narcotic.  (I inadvertently ordered all three medications that did not transmit electronically, and overlooked that this was a narcotic)  Requested Prescriptions  Pending Prescriptions Disp Refills   guaiFENesin-codeine (ROBITUSSIN AC) 100-10 MG/5ML syrup 118 mL 0    Sig: Take 5 mLs by mouth 4 (four) times daily as needed for cough.      Off-Protocol Failed - 07/17/2020  4:16 PM      Failed - Medication not assigned to a protocol, review manually.      Passed - Valid encounter within last 12 months    Recent Outpatient Visits           Today Acute maxillary sinusitis, recurrence not specified   [facility] Duanne Limerick, MD   2 months ago Mild intermittent asthma without complication   [facility] Duanne Limerick, MD   9 months ago Mild intermittent asthma without complication   [facility] Duanne Limerick, MD   1 year ago Cellulitis of left upper extremity   [facility] Duanne Limerick, MD   1 year ago Mild intermittent asthma without complication   [facility] Duanne Limerick, MD                 Signed Prescriptions Disp Refills   fluconazole (DIFLUCAN) 150 MG tablet 1 tablet 0    Sig: Take 1 tablet (150 mg total) by mouth once for 1 dose.      Off-Protocol Failed - 07/17/2020  4:16 PM      Failed - Medication not assigned to a protocol, review manually.      Passed - Valid encounter within last 12 months    Recent Outpatient Visits           Today Acute maxillary sinusitis, recurrence not specified   [facility] Duanne Limerick, MD   2 months ago Mild intermittent asthma without complication   [facility] Duanne Limerick, MD   9 months ago Mild intermittent asthma without  complication   [facility] Duanne Limerick, MD   1 year ago Cellulitis of left upper extremity   [facility] Duanne Limerick, MD   1 year ago Mild intermittent asthma without complication   [facility] Duanne Limerick, MD                  amoxicillin-clavulanate (AUGMENTIN) 875-125 MG tablet 20 tablet 0    Sig: Take 1 tablet by mouth 2 (two) times daily.      Off-Protocol Failed - 07/17/2020  4:16 PM      Failed - Medication not assigned to a protocol, review manually.      Passed - Valid encounter within last 12 months    Recent Outpatient Visits           Today Acute maxillary sinusitis, recurrence not specified   [facility] Duanne Limerick, MD   2 months ago Mild intermittent asthma without complication   [facility] Duanne Limerick, MD   9 months ago Mild intermittent asthma without complication   [facility] Duanne Limerick, MD   1 year ago Cellulitis of left upper extremity  [facility] Duanne Limerick, MD   1 year ago Mild intermittent asthma without complication   [facility] Duanne Limerick, MD

## 2020-07-17 NOTE — Telephone Encounter (Signed)
rx's sent electronically from PCP today did not transmit.  Call placed to [facility] On [address]  Gave v.o. to Pharmacist for Guaifenesin-Codeine, Fluconazole, and Amoxicillin-Clavulanate per previous orders from Dr. Yetta Barre today.  Pt. Was notified per phone; lmom that rx's were called to pharmacy.

## 2020-07-18 NOTE — Telephone Encounter (Signed)
Spoke to pt- picked up meds this am

## 2020-10-15 ENCOUNTER — Telehealth: Payer: Self-pay

## 2020-10-15 NOTE — Telephone Encounter (Signed)
Called and left patient a VM asking if she could return my call. Need to know when patient last had a pap smear. Also, if she has ever had a hysterectomy or if she has a obgyn.  If she has a OBGYN she needs to call and schedule a pap smear with them. If not, she needs to make a physical with Dr Jones for physical with PAP if no hysterectomy.  Waiting for call back. 

## 2020-11-12 ENCOUNTER — Other Ambulatory Visit: Payer: Self-pay

## 2020-11-12 ENCOUNTER — Emergency Department: Payer: BC Managed Care – PPO

## 2020-11-12 ENCOUNTER — Emergency Department
Admission: EM | Admit: 2020-11-12 | Discharge: 2020-11-12 | Disposition: A | Discharge status: Home / Self Care | Payer: BC Managed Care – PPO | Attending: Emergency Medicine | Admitting: Emergency Medicine

## 2020-11-12 DIAGNOSIS — Z96651 Presence of right artificial knee joint: Secondary | ICD-10-CM | POA: Insufficient documentation

## 2020-11-12 DIAGNOSIS — J449 Chronic obstructive pulmonary disease, unspecified: Secondary | ICD-10-CM | POA: Insufficient documentation

## 2020-11-12 DIAGNOSIS — J45909 Unspecified asthma, uncomplicated: Secondary | ICD-10-CM | POA: Insufficient documentation

## 2020-11-12 DIAGNOSIS — X58XXXA Exposure to other specified factors, initial encounter: Secondary | ICD-10-CM | POA: Insufficient documentation

## 2020-11-12 DIAGNOSIS — Z87891 Personal history of nicotine dependence: Secondary | ICD-10-CM | POA: Diagnosis not present

## 2020-11-12 DIAGNOSIS — Z7951 Long term (current) use of inhaled steroids: Secondary | ICD-10-CM | POA: Insufficient documentation

## 2020-11-12 DIAGNOSIS — R0789 Other chest pain: Secondary | ICD-10-CM | POA: Insufficient documentation

## 2020-11-12 DIAGNOSIS — T17908A Unspecified foreign body in respiratory tract, part unspecified causing other injury, initial encounter: Secondary | ICD-10-CM

## 2020-11-12 DIAGNOSIS — T17900A Unspecified foreign body in respiratory tract, part unspecified causing asphyxiation, initial encounter: Secondary | ICD-10-CM | POA: Diagnosis present
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
## 2020-11-12 NOTE — ED Triage Notes (Addendum)
Pt comes with c/o trouble swallowing due to pill stuck in throat. Pt states she was taking her vitamin pill this morning and it is now stuck. Pt able to swallow and speak in complete sentences. Pt states she feels like it is dissolving and in her lung.  Pt has strong cough present.

## 2020-11-12 NOTE — ED Triage Notes (Signed)
First Nurse note:  Arrives with c/o 'aspirating half a vitamin' this morning and stating that vitamin is in her airway.  AAOx3.  Skin warm and dry.  Strong cough heard.  Voice clear and strong. Respirations regular and non labored.  No SOB/ DOE.  NAD

## 2020-11-14 ENCOUNTER — Ambulatory Visit: Payer: BC Managed Care – PPO | Admitting: Family Medicine

## 2020-11-14 ENCOUNTER — Ambulatory Visit
Admission: RE | Admit: 2020-11-14 | Discharge: 2020-11-14 | Disposition: A | Discharge status: Home / Self Care | Payer: BC Managed Care – PPO | Source: Ambulatory Visit | Attending: Family Medicine | Admitting: Family Medicine

## 2020-11-14 ENCOUNTER — Other Ambulatory Visit: Payer: Self-pay

## 2020-11-14 ENCOUNTER — Ambulatory Visit
Admission: RE | Admit: 2020-11-14 | Discharge: 2020-11-14 | Disposition: A | Discharge status: Home / Self Care | Payer: BC Managed Care – PPO | Attending: Family Medicine | Admitting: Family Medicine

## 2020-11-14 ENCOUNTER — Ambulatory Visit: Payer: Self-pay

## 2020-11-14 ENCOUNTER — Encounter: Payer: Self-pay | Admitting: Family Medicine

## 2020-11-14 VITALS — BP 128/80 | HR 68 | Temp 98.4°F | Ht 69.0 in | Wt 260.0 lb

## 2020-11-14 DIAGNOSIS — J449 Chronic obstructive pulmonary disease, unspecified: Secondary | ICD-10-CM

## 2020-11-14 DIAGNOSIS — T17900D Unspecified foreign body in respiratory tract, part unspecified causing asphyxiation, subsequent encounter: Secondary | ICD-10-CM | POA: Diagnosis not present

## 2020-11-14 NOTE — Addendum Note (Signed)
Addended by: Everitt Amber on: 11/14/2020 03:14 PM   Modules accepted: Orders

## 2020-11-14 NOTE — Telephone Encounter (Signed)
Called pt- will see this afternoon
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
## 2020-11-14 NOTE — Patient Instructions (Signed)
[facility], 6(1), 16-27. https://doi.org/https://doi.org/10.1016/j.ehmc.2016.07.002">  Aspiration Precautions, Adult Aspiration is the breathing in (inhalation) of a liquid or other material into the lungs. Adults with neurologicalimpairments, swallowing difficulties (dysphagia), decreased gag reflex, or impaired physical mobility are at risk for aspiration. Things that can be inhaled into the lungs include:  Food.  Any type of liquid, such as drinks or saliva.  Stomach contents, such as vomit or stomach acid. Aspiration can cause pneumonia. You can take steps to reduce the risk of aspiration. What are the signs of aspiration? Signs of aspiration include:  Coughing: ? Coughing after swallowing food or liquids. ? Coughing up phlegm (sputum) that:  Is yellow, tan, or green.  Has pieces of food in it.  Smells bad. ? Coughing when lying down or having to sit up quickly after lying down. ? Having a hoarse, barky cough.  Trouble breathing. This may include: ? Breathing quickly. ? Breathing very slowly. ? Loud breathing. ? Rumbling sounds from the lungs while breathing.  Eating troubles, such as: ? Clearing the throat often while eating. ? Drooling while eating. ? A feeling of fullness in the throat or a feeling that something is stuck in the throat. ? Having a runny nose while eating. ? Watery eyes while eating. ? A pained look on the face while eating.  Speaking problems such as: ? Not being able to speak. ? A hoarse voice.  Choking often.  A change in skin color. The skin may look red or blue.  Fever.  Pain in the chest or back. What are the complications of aspiration? Complications of aspiration include:  Losing weight because the person is not absorbing needed nutrients.  Loss of enjoyment and the social benefits of eating.  Choking.  Lung irritation, if someone aspirates acidic food or drinks.  Lung infection (pneumonia).  Lung abscess,  which is a collection of infected liquid (pus) in the lungs. In serious cases, death can occur. What can I do to prevent aspiration? Caring for someone who has a feeding tube If you are caring for someone who has a feeding tube and cannot eat or drink safely through his or her mouth:  Keep the person in an upright position as much as possible.  Do not lay the person flat if he or she is getting continuous feedings. If you need to lay the person flat for any reason, turn the feeding pump off.  Check feeding tube residuals as told by the health care provider. Ask the health care provider what residual amount is too high. Caring for someone who can eat and drink safely by mouth If you are caring for someone who can eat and drink safely by mouth:  Have the person sit in an upright position when eating food or drinking fluids. This can be done in two ways: ? Have the person sit up in a chair. ? If sitting in a chair is not possible, position the person in bed so he or she is upright.  Remind the person to eat slowly and chew well. Make sure the person is awake and alert while eating. Never put food or liquids in the mouth of a person who is not fully alert.  Do not distract the person. This is especially important for people with thinking or memory (cognitive) problems.  Allow foods to cool. Hot foods may be more difficult to swallow.  Provide small meals more frequently instead of three large meals. This may reduce fatigue during eating.  After  the person has finishing eating: ? Check the person's mouth thoroughly for leftover food. ? Keep the person sitting upright for 30-45 minutes.  Do not serve food or drink within 2 hours of bedtime. General instructions Follow these general guidelines to prevent aspiration in someone who can eat and drink safely by mouth:  Feed small amounts of food. Do not force-feed.  For a person who is on a diet for swallowing difficulty (dysphagia diet),  follow the recommended food and drink consistency. For example, you may be told to thicken a liquid using a commercial food thickening product.  Use as little water as possible when brushing the person's teeth or cleaning his or her mouth.  Provide oral care before and after meals.  Use adaptive devices, such as cut-out cups or other utensils, as told by the health care provider.  Crush pills and put them in soft food such as pudding or ice cream. Some pills should not be crushed. Check with the health care provider before crushing any medicine.  If someone is choking on food or an object, perform the Heimlich maneuver (abdominal thrusts).   Contact a health care provider if the person:  Has a feeding tube, and the feeding tube residual amount is too high.  Has a fever.  Tries to avoid food or water, such as refusing to eat, drink, or be fed, or is eating less than normal.  May have aspirated food or liquid.  Is showing warning signs, such as choking or coughing, when he or she eats or drinks. Get help right away if the person:  Has trouble breathing or starts to breathe quickly.  Is breathing very slowly or stops breathing.  Coughs a lot after eating or drinking.  Has a long-lasting (chronic) cough.  Coughs up thick, yellow, or tan phlegm.  Has symptoms of pneumonia, such as: ? Coughing a lot. ? Coughing up mucus with a bad smell or blood in it. ? Feeling short of breath. ? Complaining of chest pain. ? Sweating, fever, and chills. ? Feeling tired. ? Complaining of trouble breathing. ? Wheezing.  Cannot stop choking.  Is unable to breathe, turns blue, faints, or seems confused. These symptoms may represent a serious problem that is an emergency. Do not wait to see if the symptoms will go away. Get medical help right away. Call your local emergency services (911 in the [place]  Summary  Aspiration is the breathing in (inhalation) of a liquid or material into the lungs.  Things that can be inhaled into the lungs include food, liquids, saliva, or stomach contents.  Aspiration can cause pneumonia or choking.  One sign of aspiration is coughing after swallowing food or liquids.  Contact your health care provider if you notice signs of aspiration. This information is not intended to replace advice given to you by your health care provider. Make sure you discuss any questions you have with your health care provider. Document Revised: 02/26/2020 Document Reviewed: 06/25/2019 Elsevier Patient Education  2021 [employer].

## 2020-11-14 NOTE — Telephone Encounter (Signed)
  Pt. Reports she aspirated a vitamin 11/12/20 and was seen in ED. Has asthma and has been using her nebulizer.Has mild shortness of breath and wheezing. Coughing up mucus. States she feels like she needs an antibiotic and would like to be seen. Spoke with Marchelle Folks in the practice and will send triage for review. Please advise pt.  Answer Assessment - Initial Assessment Questions 1. RESPIRATORY STATUS: "Describe your breathing?" (e.g., wheezing, shortness of breath, unable to speak, severe coughing)      Shortness of breath 2. ONSET: "When did this breathing problem begin?"      11/12/20 Aspirated 3. PATTERN "Does the difficult breathing come and go, or has it been constant since it started?"      Comes and goes 4. SEVERITY: "How bad is your breathing?" (e.g., mild, moderate, severe)    - MILD: No SOB at rest, mild SOB with walking, speaks normally in sentences, can lie down, no retractions, pulse < 100.    - MODERATE: SOB at rest, SOB with minimal exertion and prefers to sit, cannot lie down flat, speaks in phrases, mild retractions, audible wheezing, pulse 100-120.    - SEVERE: Very SOB at rest, speaks in single words, struggling to breathe, sitting hunched forward, retractions, pulse > 120      Mild 5. RECURRENT SYMPTOM: "Have you had difficulty breathing before?" If Yes, ask: "When was the last time?" and "What happened that time?"      Yes 6. CARDIAC HISTORY: "Do you have any history of heart disease?" (e.g., heart attack, angina, bypass surgery, angioplasty)      No 7. LUNG HISTORY: "Do you have any history of lung disease?"  (e.g., pulmonary embolus, asthma, emphysema)     Asthma 8. CAUSE: "What do you think is causing the breathing problem?"      Aspirated 9. OTHER SYMPTOMS: "Do you have any other symptoms? (e.g., dizziness, runny nose, cough, chest pain, fever)     Wheexing 10. O2 SATURATION MONITOR:  "Do you use an oxygen saturation monitor (pulse oximeter) at home?" If Yes, "What is  your reading (oxygen level) today?" "What is your usual oxygen saturation reading?" (e.g., 95%)       No 11. PREGNANCY: "Is there any chance you are pregnant?" "When was your last menstrual period?"       No 12. TRAVEL: "Have you traveled out of the country in the last month?" (e.g., travel history, exposures)       No  Protocols used: BREATHING DIFFICULTY-A-AH

## 2020-11-20 ENCOUNTER — Other Ambulatory Visit: Payer: Self-pay | Admitting: Pulmonary Disease

## 2020-11-20 DIAGNOSIS — J8410 Pulmonary fibrosis, unspecified: Secondary | ICD-10-CM

## 2020-11-26 ENCOUNTER — Other Ambulatory Visit: Payer: Self-pay | Admitting: Family Medicine

## 2020-11-26 DIAGNOSIS — F329 Major depressive disorder, single episode, unspecified: Secondary | ICD-10-CM

## 2020-11-26 NOTE — Telephone Encounter (Signed)
Requested Prescriptions  Pending Prescriptions Disp Refills  . sertraline (ZOLOFT) 100 MG tablet [Pharmacy Med Name: SERTRALINE HCL 100 MG TABLET] 90 tablet 1    Sig: TAKE 1 TABLET BY MOUTH EVERY DAY     Psychiatry:  Antidepressants - SSRI Passed - 11/26/2020  8:28 AM      Passed - Valid encounter within last 6 months    Recent Outpatient Visits          1 week ago Aspiration syndrome, subsequent encounter   [facility] Duanne Limerick, MD   4 months ago Acute maxillary sinusitis, recurrence not specified   [facility] Duanne Limerick, MD   6 months ago Mild intermittent asthma without complication   [facility] Duanne Limerick, MD   1 year ago Mild intermittent asthma without complication   [facility] Duanne Limerick, MD   1 year ago Cellulitis of left upper extremity   [facility] Duanne Limerick, MD

## 2020-12-05 ENCOUNTER — Telehealth: Payer: Self-pay

## 2020-12-05 NOTE — Telephone Encounter (Signed)
Patient needs a CPE with Dr Yetta Barre to discuss health maintenance ( Mammogram, Pap smear, Physical Exam)  Please call her to schedule a CPE at her convenience.

## 2020-12-05 NOTE — Telephone Encounter (Signed)
Left message to schedule cpe

## 2020-12-17 ENCOUNTER — Ambulatory Visit
Admission: RE | Admit: 2020-12-17 | Discharge: 2020-12-17 | Disposition: A | Discharge status: Home / Self Care | Payer: BC Managed Care – PPO | Source: Ambulatory Visit | Attending: Pulmonary Disease | Admitting: Pulmonary Disease

## 2020-12-17 ENCOUNTER — Other Ambulatory Visit: Payer: Self-pay

## 2020-12-17 DIAGNOSIS — J8410 Pulmonary fibrosis, unspecified: Secondary | ICD-10-CM

## 2020-12-30 ENCOUNTER — Other Ambulatory Visit: Payer: Self-pay | Admitting: Family Medicine

## 2020-12-30 DIAGNOSIS — J4521 Mild intermittent asthma with (acute) exacerbation: Secondary | ICD-10-CM

## 2021-02-07 ENCOUNTER — Other Ambulatory Visit: Payer: Self-pay | Admitting: Family Medicine

## 2021-02-07 DIAGNOSIS — J301 Allergic rhinitis due to pollen: Secondary | ICD-10-CM

## 2021-02-07 DIAGNOSIS — J4520 Mild intermittent asthma, uncomplicated: Secondary | ICD-10-CM

## 2021-02-08 NOTE — Telephone Encounter (Signed)
Requested Prescriptions  Pending Prescriptions Disp Refills  . montelukast (SINGULAIR) 10 MG tablet [Pharmacy Med Name: MONTELUKAST SOD 10 MG TABLET] 90 tablet 1    Sig: TAKE 1 TABLET BY MOUTH EVERY DAY     Pulmonology:  Leukotriene Inhibitors Passed - 02/07/2021  5:16 PM      Passed - Valid encounter within last 12 months    Recent Outpatient Visits          2 months ago Aspiration syndrome, subsequent encounter   [facility] Duanne Limerick, MD   6 months ago Acute maxillary sinusitis, recurrence not specified   [facility] Duanne Limerick, MD   9 months ago Mild intermittent asthma without complication   [facility] Duanne Limerick, MD   1 year ago Mild intermittent asthma without complication   [facility] Duanne Limerick, MD   1 year ago Cellulitis of left upper extremity   [facility] Duanne Limerick, MD             . [place] INHUB 250-50 MCG/ACT AEPB [Pharmacy Med Name: [place] 250-50 INHUB] 180 each 1    Sig: INHALE 1 PUFF INTO THE LUNGS TWICE A DAY     Pulmonology:  Combination Products Passed - 02/07/2021  5:16 PM      Passed - Valid encounter within last 12 months    Recent Outpatient Visits          2 months ago Aspiration syndrome, subsequent encounter   [facility] Duanne Limerick, MD   6 months ago Acute maxillary sinusitis, recurrence not specified   [facility] Duanne Limerick, MD   9 months ago Mild intermittent asthma without complication   [facility] Duanne Limerick, MD   1 year ago Mild intermittent asthma without complication   [facility] Duanne Limerick, MD   1 year ago Cellulitis of left upper extremity   [facility] Duanne Limerick, MD

## 2021-03-13 ENCOUNTER — Other Ambulatory Visit: Payer: Self-pay | Admitting: Family Medicine

## 2021-03-13 DIAGNOSIS — J4521 Mild intermittent asthma with (acute) exacerbation: Secondary | ICD-10-CM

## 2021-04-24 ENCOUNTER — Other Ambulatory Visit: Payer: Self-pay

## 2021-04-24 ENCOUNTER — Ambulatory Visit: Payer: BC Managed Care – PPO | Admitting: Family Medicine

## 2021-04-24 ENCOUNTER — Encounter: Payer: Self-pay | Admitting: Family Medicine

## 2021-04-24 VITALS — BP 120/80 | HR 76 | Ht 69.0 in | Wt 254.0 lb

## 2021-04-24 DIAGNOSIS — B379 Candidiasis, unspecified: Secondary | ICD-10-CM | POA: Diagnosis not present

## 2021-04-24 DIAGNOSIS — J0100 Acute maxillary sinusitis, unspecified: Secondary | ICD-10-CM

## 2021-04-24 MED ORDER — FLUCONAZOLE 150 MG PO TABS
150.0000 mg | ORAL_TABLET | Freq: Once | ORAL | 0 refills | Status: AC
Start: 2021-04-24 — End: 2021-04-24

## 2021-04-24 MED ORDER — AMOXICILLIN-POT CLAVULANATE 875-125 MG PO TABS: 1.0000 | ORAL_TABLET | Freq: Two times a day (BID) | ORAL | 0 refills | Status: DC
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
## 2021-06-02 ENCOUNTER — Encounter: Payer: Self-pay | Admitting: Family Medicine

## 2021-08-10 ENCOUNTER — Other Ambulatory Visit: Payer: Self-pay | Admitting: Family Medicine

## 2021-08-10 DIAGNOSIS — J301 Allergic rhinitis due to pollen: Secondary | ICD-10-CM

## 2021-08-10 DIAGNOSIS — J4520 Mild intermittent asthma, uncomplicated: Secondary | ICD-10-CM

## 2021-08-17 ENCOUNTER — Other Ambulatory Visit: Payer: Self-pay

## 2021-08-17 ENCOUNTER — Encounter: Payer: Self-pay | Admitting: Family Medicine

## 2021-08-17 ENCOUNTER — Ambulatory Visit: Payer: BC Managed Care – PPO | Admitting: Family Medicine

## 2021-08-17 VITALS — BP 120/80 | HR 80 | Ht 69.0 in | Wt 266.0 lb

## 2021-08-17 DIAGNOSIS — K047 Periapical abscess without sinus: Secondary | ICD-10-CM

## 2021-08-17 DIAGNOSIS — R220 Localized swelling, mass and lump, head: Secondary | ICD-10-CM

## 2021-08-17 DIAGNOSIS — F329 Major depressive disorder, single episode, unspecified: Secondary | ICD-10-CM | POA: Diagnosis not present

## 2021-08-17 DIAGNOSIS — J0100 Acute maxillary sinusitis, unspecified: Secondary | ICD-10-CM

## 2021-08-17 MED ORDER — AMOXICILLIN-POT CLAVULANATE 875-125 MG PO TABS: 1.0000 | ORAL_TABLET | Freq: Two times a day (BID) | ORAL | 0 refills | Status: DC

## 2021-08-17 MED ORDER — PREDNISONE 10 MG PO TABS: 10.0000 mg | ORAL_TABLET | Freq: Every day | ORAL | 0 refills | Status: DC

## 2021-08-17 MED ORDER — SERTRALINE HCL 100 MG PO TABS: 100.0000 mg | ORAL_TABLET | Freq: Every day | ORAL | 1 refills | Status: DC
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
## 2021-08-24 ENCOUNTER — Other Ambulatory Visit: Payer: Self-pay | Admitting: Family Medicine

## 2021-08-24 DIAGNOSIS — J4520 Mild intermittent asthma, uncomplicated: Secondary | ICD-10-CM

## 2021-08-25 NOTE — Telephone Encounter (Signed)
Requested medication (s) are due for refill today: yes  Requested medication (s) are on the active medication list: yes  Last refill:  05/07/2020 6.7g with 11 RF  Future visit scheduled: 02/15/2022  Notes to clinic:  Last see this condition/med addressed at 05/07/2020 appt, please assess.  Requested Prescriptions  Pending Prescriptions Disp Refills   albuterol (VENTOLIN HFA) 108 (90 Base) MCG/ACT inhaler [Pharmacy Med Name: ALBUTEROL HFA (PROVENTIL) INH] 6.7 each 11    Sig: INHALE 1 TO 2 PUFFS INTO THE LUNGS EVERY 6 HOURS AS NEEDED FOR SHORTNESS OF BREATH     Pulmonology:  [facility] 2 Passed - 08/24/2021  8:30 PM      Passed - Last BP in normal range    BP Readings from Last 1 Encounters:  08/17/21 120/80          Passed - Last Heart Rate in normal range    Pulse Readings from Last 1 Encounters:  08/17/21 80          Passed - Valid encounter within last 12 months    Recent Outpatient Visits           1 week ago Abscessed tooth   [facility] Duanne Limerick, MD   4 months ago Acute maxillary sinusitis, recurrence not specified   [facility] Duanne Limerick, MD   9 months ago Aspiration syndrome, subsequent encounter   [facility] Duanne Limerick, MD   1 year ago Acute maxillary sinusitis, recurrence not specified   [facility] Duanne Limerick, MD   1 year ago Mild intermittent asthma without complication   [facility] Duanne Limerick, MD       Future Appointments             In 5 months Duanne Limerick, MD [facility], [facility]

## 2021-09-21 ENCOUNTER — Telehealth: Payer: Self-pay

## 2021-09-21 NOTE — Telephone Encounter (Signed)
Called and left message concerning pap- do we need to schedule or get her in with GYN? ?

## 2021-10-08 DIAGNOSIS — F32A Depression, unspecified: Secondary | ICD-10-CM | POA: Insufficient documentation

## 2021-10-08 DIAGNOSIS — Z8719 Personal history of other diseases of the digestive system: Secondary | ICD-10-CM | POA: Insufficient documentation

## 2021-10-08 DIAGNOSIS — K219 Gastro-esophageal reflux disease without esophagitis: Secondary | ICD-10-CM | POA: Insufficient documentation

## 2021-10-08 DIAGNOSIS — J45909 Unspecified asthma, uncomplicated: Secondary | ICD-10-CM | POA: Insufficient documentation

## 2021-10-08 DIAGNOSIS — J189 Pneumonia, unspecified organism: Secondary | ICD-10-CM | POA: Insufficient documentation

## 2021-10-08 DIAGNOSIS — R4586 Emotional lability: Secondary | ICD-10-CM | POA: Insufficient documentation

## 2021-10-09 ENCOUNTER — Other Ambulatory Visit: Payer: Self-pay

## 2021-10-09 DIAGNOSIS — Z01419 Encounter for gynecological examination (general) (routine) without abnormal findings: Secondary | ICD-10-CM

## 2021-11-26 ENCOUNTER — Other Ambulatory Visit (HOSPITAL_COMMUNITY)
Admission: RE | Admit: 2021-11-26 | Discharge: 2021-11-26 | Disposition: A | Discharge status: Home / Self Care | Payer: BC Managed Care – PPO | Source: Ambulatory Visit | Attending: Obstetrics and Gynecology | Admitting: Obstetrics and Gynecology

## 2021-11-26 ENCOUNTER — Encounter: Payer: Self-pay | Admitting: Obstetrics and Gynecology

## 2021-11-26 ENCOUNTER — Ambulatory Visit (INDEPENDENT_AMBULATORY_CARE_PROVIDER_SITE_OTHER): Payer: BC Managed Care – PPO | Admitting: Obstetrics and Gynecology

## 2021-11-26 VITALS — BP 128/64 | HR 56 | Ht 67.0 in | Wt 275.0 lb

## 2021-11-26 DIAGNOSIS — Z1159 Encounter for screening for other viral diseases: Secondary | ICD-10-CM

## 2021-11-26 DIAGNOSIS — Z114 Encounter for screening for human immunodeficiency virus [HIV]: Secondary | ICD-10-CM | POA: Diagnosis not present

## 2021-11-26 DIAGNOSIS — Z124 Encounter for screening for malignant neoplasm of cervix: Secondary | ICD-10-CM | POA: Diagnosis present

## 2021-11-26 DIAGNOSIS — Z01419 Encounter for gynecological examination (general) (routine) without abnormal findings: Secondary | ICD-10-CM | POA: Diagnosis not present

## 2021-11-26 LAB — RESULTS CONSOLE HPV: CHL HPV: NEGATIVE

## 2021-11-26 LAB — HM PAP SMEAR: HM Pap smear: NORMAL

## 2021-11-26 MED ORDER — NYSTATIN 100000 UNIT/GM EX POWD: 1.0000 "application " | Freq: Three times a day (TID) | CUTANEOUS | 3 refills | Status: AC
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
## 2021-11-26 NOTE — Patient Instructions (Signed)

## 2021-11-27 LAB — CYTOLOGY - PAP
Comment: NEGATIVE
Diagnosis: NEGATIVE
High risk HPV: NEGATIVE

## 2021-11-27 LAB — HEPATITIS C ANTIBODY
Hepatitis C Ab: NONREACTIVE
SIGNAL TO CUT-OFF: 0.03 (ref <1.00)

## 2021-11-27 LAB — HIV ANTIBODY (ROUTINE TESTING W REFLEX): HIV 1&2 Ab, 4th Generation: NONREACTIVE

## 2021-12-24 ENCOUNTER — Other Ambulatory Visit: Payer: Self-pay | Admitting: Family Medicine

## 2021-12-24 DIAGNOSIS — J4520 Mild intermittent asthma, uncomplicated: Secondary | ICD-10-CM

## 2022-02-15 ENCOUNTER — Encounter: Payer: Self-pay | Admitting: Family Medicine

## 2022-02-15 ENCOUNTER — Ambulatory Visit: Payer: BC Managed Care – PPO | Admitting: Family Medicine

## 2022-02-15 VITALS — BP 130/80 | HR 68 | Ht 67.0 in | Wt 265.0 lb

## 2022-02-15 DIAGNOSIS — J301 Allergic rhinitis due to pollen: Secondary | ICD-10-CM

## 2022-02-15 DIAGNOSIS — R233 Spontaneous ecchymoses: Secondary | ICD-10-CM

## 2022-02-15 DIAGNOSIS — J4520 Mild intermittent asthma, uncomplicated: Secondary | ICD-10-CM

## 2022-02-15 DIAGNOSIS — E785 Hyperlipidemia, unspecified: Secondary | ICD-10-CM

## 2022-02-15 DIAGNOSIS — F329 Major depressive disorder, single episode, unspecified: Secondary | ICD-10-CM | POA: Diagnosis not present

## 2022-02-15 MED ORDER — MONTELUKAST SODIUM 10 MG PO TABS: ORAL_TABLET | ORAL | 1 refills | Status: DC

## 2022-02-15 MED ORDER — FLUTICASONE-SALMETEROL 250-50 MCG/ACT IN AEPB: INHALATION_SPRAY | RESPIRATORY_TRACT | 1 refills | Status: DC

## 2022-02-15 MED ORDER — ALBUTEROL SULFATE HFA 108 (90 BASE) MCG/ACT IN AERS: INHALATION_SPRAY | RESPIRATORY_TRACT | 5 refills | Status: DC

## 2022-02-15 MED ORDER — SERTRALINE HCL 100 MG PO TABS: 100.0000 mg | ORAL_TABLET | Freq: Every day | ORAL | 1 refills | Status: DC
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
## 2022-02-17 LAB — COMPREHENSIVE METABOLIC PANEL
ALT: 48 IU/L — ABNORMAL HIGH (ref 0–32)
AST: 46 IU/L — ABNORMAL HIGH (ref 0–40)
Albumin/Globulin Ratio: 1.9 (ref 1.2–2.2)
Albumin: 4.1 g/dL (ref 3.9–4.9)
Alkaline Phosphatase: 120 IU/L (ref 44–121)
BUN/Creatinine Ratio: 18 (ref 12–28)
BUN: 12 mg/dL (ref 8–27)
Bilirubin Total: 0.7 mg/dL (ref 0.0–1.2)
CO2: 20 mmol/L (ref 20–29)
Calcium: 9.1 mg/dL (ref 8.7–10.3)
Chloride: 108 mmol/L — ABNORMAL HIGH (ref 96–106)
Creatinine, Ser: 0.65 mg/dL (ref 0.57–1.00)
Globulin, Total: 2.2 g/dL (ref 1.5–4.5)
Glucose: 89 mg/dL (ref 70–99)
Potassium: 4.3 mmol/L (ref 3.5–5.2)
Sodium: 144 mmol/L (ref 134–144)
Total Protein: 6.3 g/dL (ref 6.0–8.5)
eGFR: 99 mL/min/{1.73_m2} (ref >59)

## 2022-02-17 LAB — CBC WITH DIFFERENTIAL/PLATELET
Basophils Absolute: 0 10*3/uL (ref 0.0–0.2)
Basos: 1 %
EOS (ABSOLUTE): 0.1 10*3/uL (ref 0.0–0.4)
Eos: 2 %
Hematocrit: 43.5 % (ref 34.0–46.6)
Hemoglobin: 15.2 g/dL (ref 11.1–15.9)
Immature Grans (Abs): 0 10*3/uL (ref 0.0–0.1)
Immature Granulocytes: 0 %
Lymphocytes Absolute: 1.9 10*3/uL (ref 0.7–3.1)
Lymphs: 33 %
MCH: 31.3 pg (ref 26.6–33.0)
MCHC: 34.9 g/dL (ref 31.5–35.7)
MCV: 90 fL (ref 79–97)
Monocytes Absolute: 0.5 10*3/uL (ref 0.1–0.9)
Monocytes: 8 %
Neutrophils Absolute: 3.2 10*3/uL (ref 1.4–7.0)
Neutrophils: 56 %
Platelets: 232 10*3/uL (ref 150–450)
RBC: 4.85 x10E6/uL (ref 3.77–5.28)
RDW: 12.4 % (ref 11.7–15.4)
WBC: 5.6 10*3/uL (ref 3.4–10.8)

## 2022-02-17 LAB — LIPID PANEL WITH LDL/HDL RATIO
Cholesterol, Total: 119 mg/dL (ref 100–199)
HDL: 42 mg/dL (ref >39)
LDL Chol Calc (NIH): 63 mg/dL (ref 0–99)
LDL/HDL Ratio: 1.5 ratio (ref 0.0–3.2)
Triglycerides: 68 mg/dL (ref 0–149)
VLDL Cholesterol Cal: 14 mg/dL (ref 5–40)

## 2022-08-14 IMAGING — CT CT CHEST HIGH RESOLUTION W/O CM
2 of 5 series · 13 of 36 positions shown, 16 images · non-contrast
Comparison: None.

CLINICAL DATA: Current smoker, lung scarring.

EXAM:
CT CHEST WITHOUT CONTRAST
TECHNIQUE: Multidetector CT imaging of the chest was performed following the
standard protocol without intravenous contrast. High resolution
imaging of the lungs, as well as inspiratory and expiratory imaging,
was performed.

[Series 2: thorax · axial · 0.66mm/px · z∈[-102,+134]mm · 10 of 144 slices shown, 13 images]
[im 13/144  mediastinal]
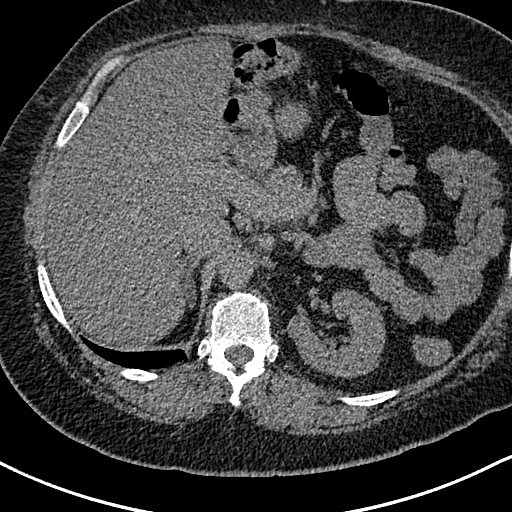
[im 13/144  lung]
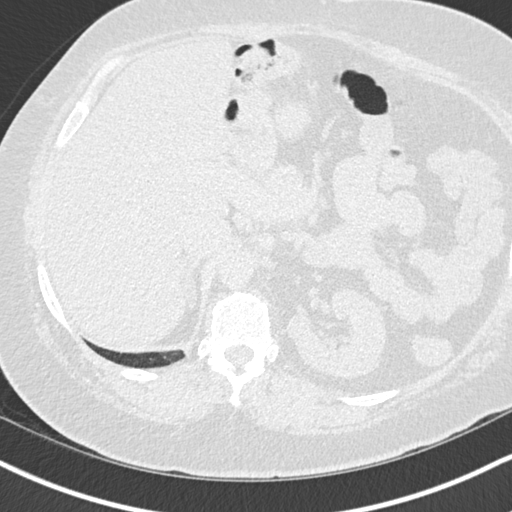
[im 25/144  lung]
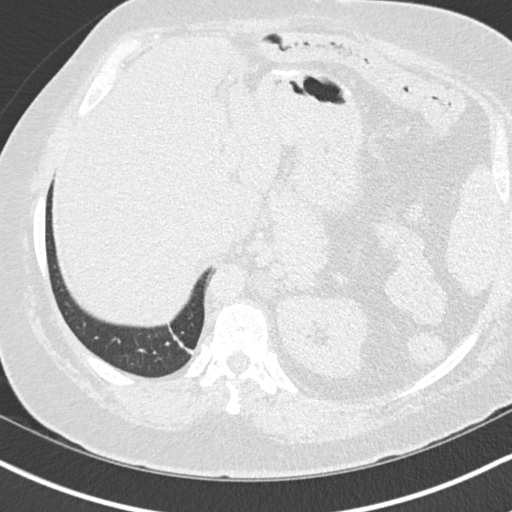
[im 38/144  lung]
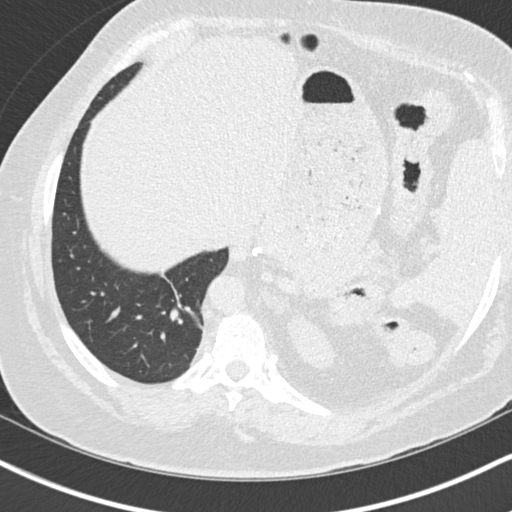
[im 50/144  lung]
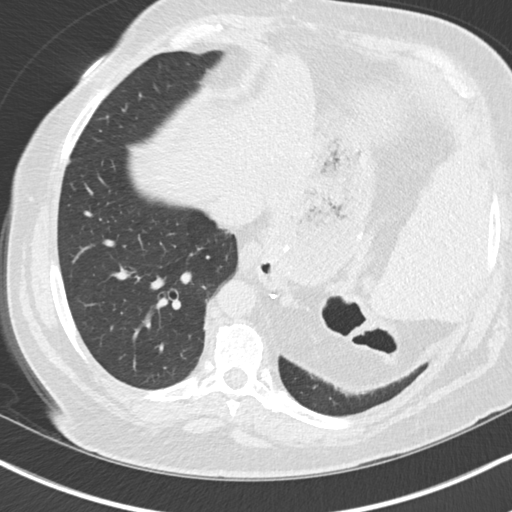
[im 63/144  mediastinal]
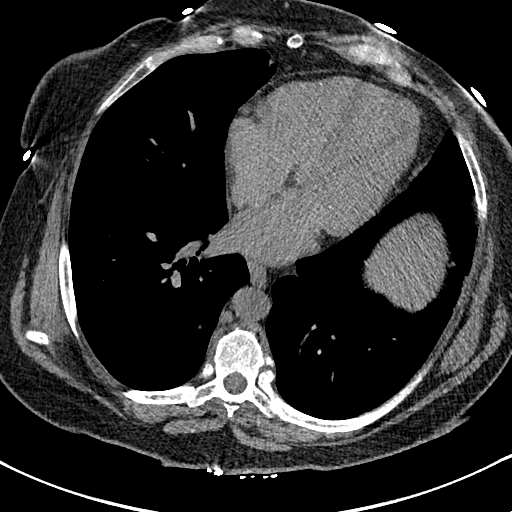
[im 63/144  lung]
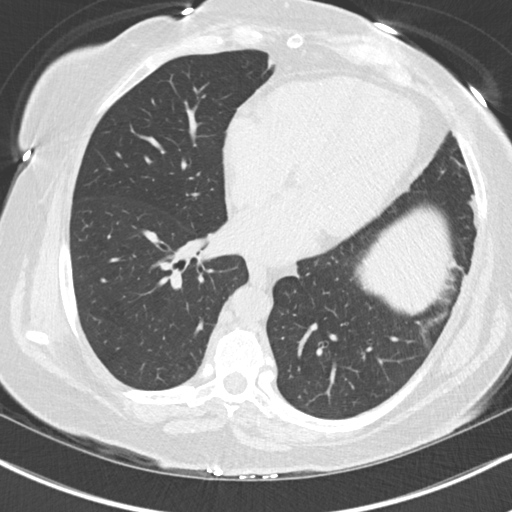
[im 81/144  lung]
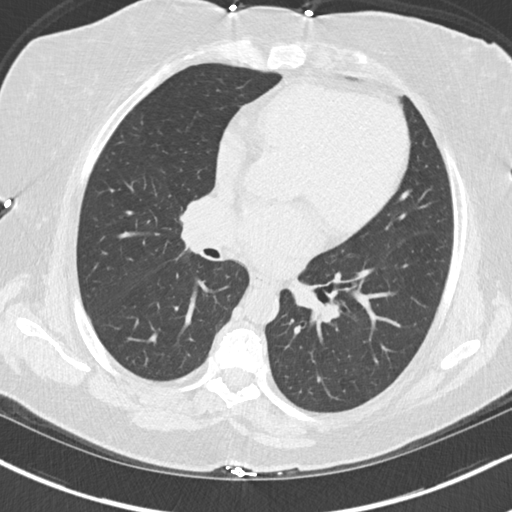
[im 94/144  lung]
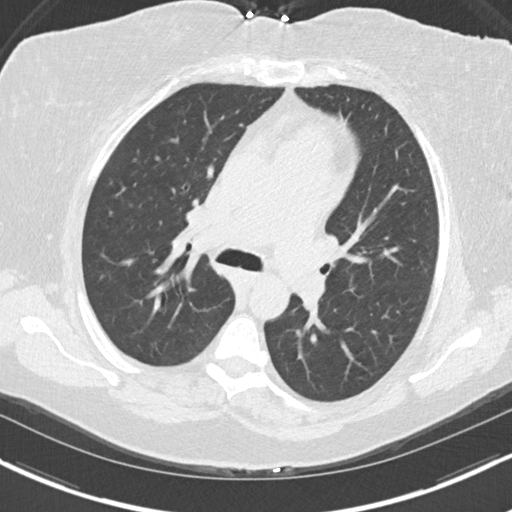
[im 106/144  lung]
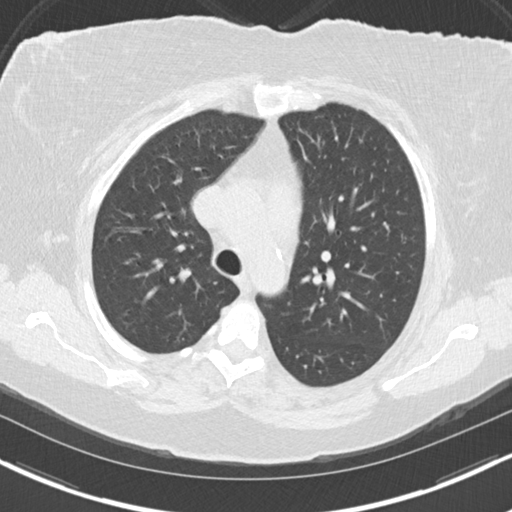
[im 119/144  mediastinal]
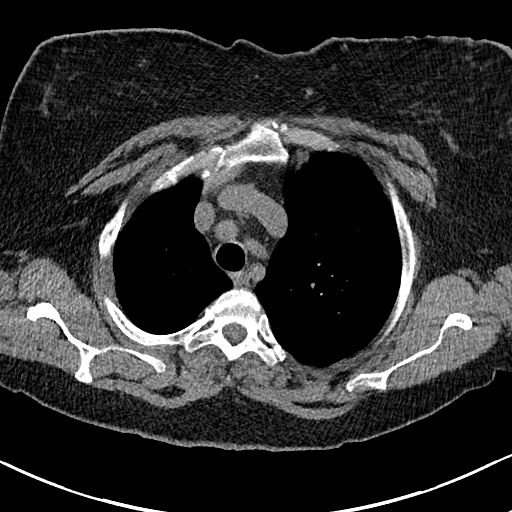
[im 119/144  lung]
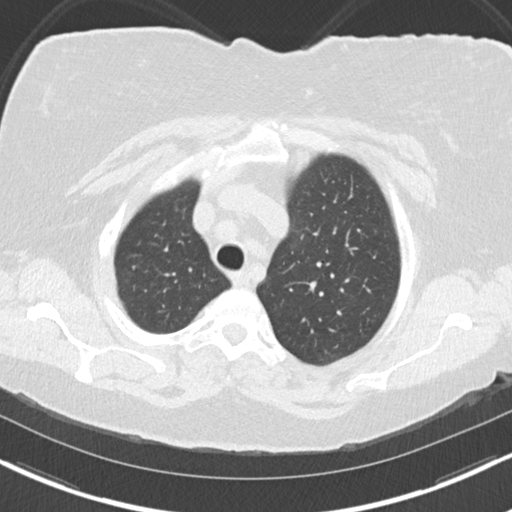
[im 131/144  lung]
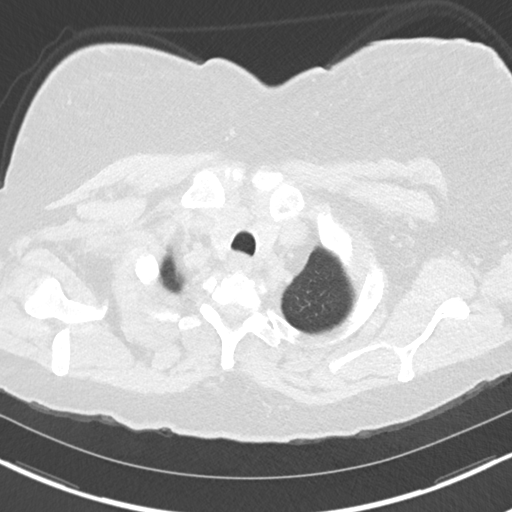

[Series 6: coronal · coronal · 0.56mm/px · 3 of 114 slices shown]
[im 23/114  lung]
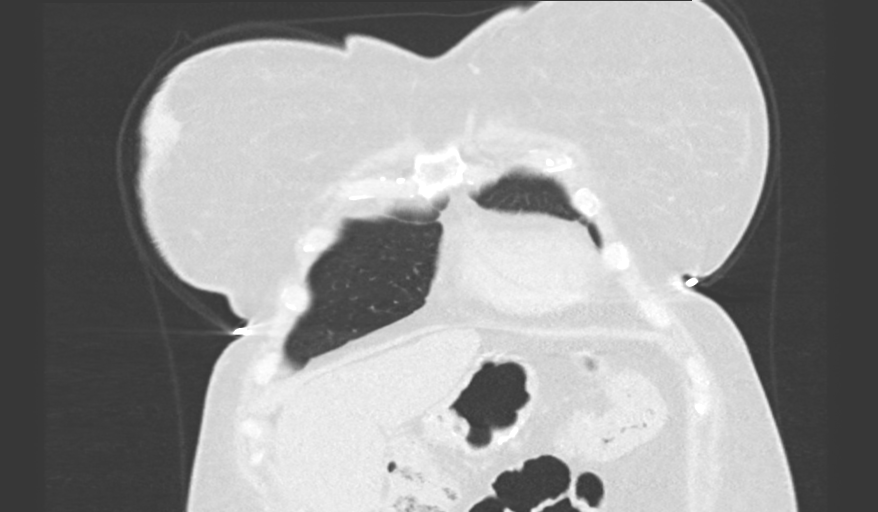
[im 46/114  lung]
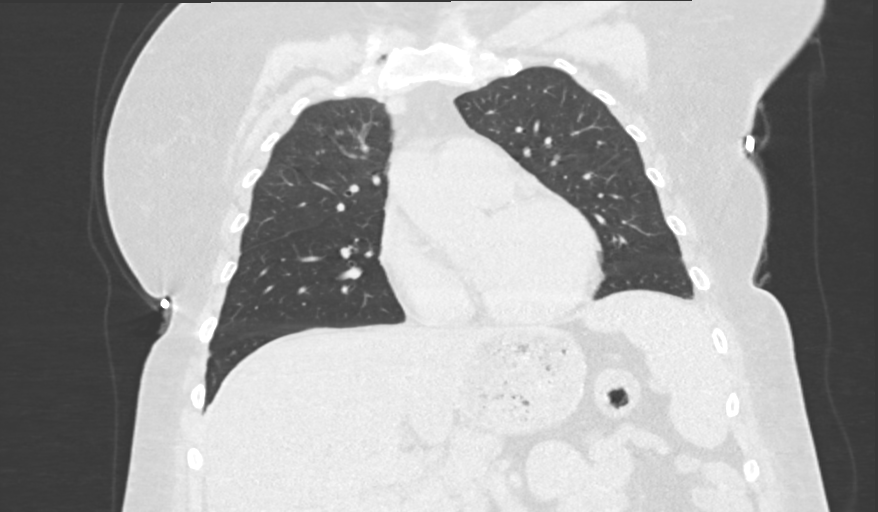
[im 68/114  lung]
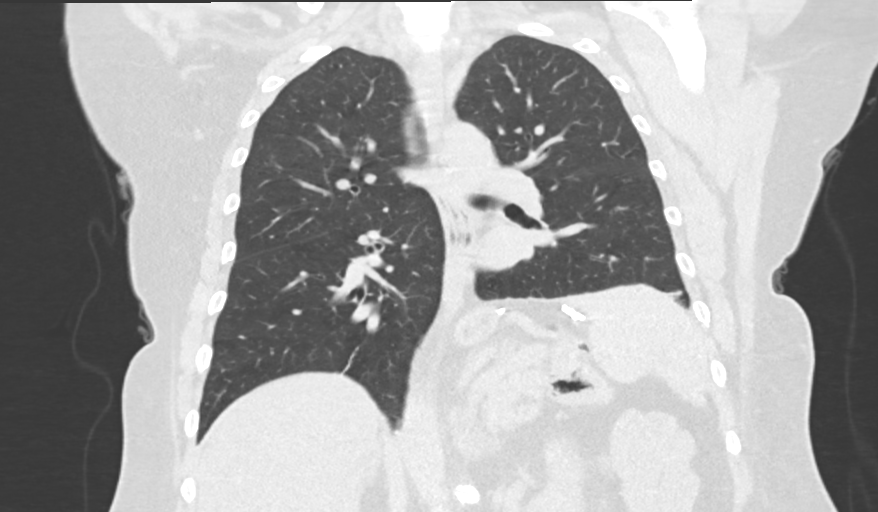

[13 of 36 positions shown; findings below may reference images not displayed]

FINDINGS: Cardiovascular: Atherosclerotic calcification of the aorta and
coronary arteries. Pulmonic trunk and heart are enlarged. No
pericardial effusion.

Mediastinum/Nodes: No pathologically enlarged mediastinal or
axillary lymph nodes. Hilar regions are difficult to definitively
evaluate without IV contrast. Esophagus is grossly unremarkable.

Lungs/Pleura: Centrilobular emphysema. Negative for subpleural
reticulation, traction bronchiectasis/bronchiolectasis,
ground-glass, architectural distortion or honeycombing. Linear
scarring in the medial right lower lobe. 3 mm subpleural right upper
lobe nodule ([DATE]). Pleuroparenchymal scarring in the lingula and
left lower lobe. No pleural fluid. Airway is unremarkable. There is
air trapping.

Upper Abdomen: Liver is decreased in attenuation diffusely. Slight
thickening of the adrenal glands bilaterally. Visualized portions of
the liver, spleen and pancreas are otherwise unremarkable.
Postoperative changes in the stomach. Elevated left hemidiaphragm.

Musculoskeletal: Degenerative changes in the spine. No worrisome
lytic or sclerotic lesions.
IMPRESSION: 1. No evidence of interstitial lung disease. Air trapping is
indicative of small airways disease.
2. 3 mm subpleural right upper lobe nodule, likely benign. No
follow-up needed if patient is low-risk. Non-contrast chest CT can
be considered in 12 months if patient is high-risk. This
recommendation follows the consensus statement: Guidelines for
Management of Incidental Pulmonary Nodules Detected on CT Images:
3. Hepatic steatosis.
4. Aortic atherosclerosis (QSH6M-R2W.W). Coronary artery
calcification.
5. Enlarged pulmonic trunk, indicative of pulmonary arterial
hypertension.
6.  Emphysema (QSH6M-0JU.T).

## 2022-08-18 ENCOUNTER — Encounter: Payer: Self-pay | Admitting: Family Medicine

## 2022-08-18 ENCOUNTER — Other Ambulatory Visit: Payer: Self-pay | Admitting: Family Medicine

## 2022-08-18 ENCOUNTER — Ambulatory Visit: Payer: BC Managed Care – PPO | Admitting: Family Medicine

## 2022-08-18 VITALS — BP 124/76 | HR 72 | Ht 69.0 in | Wt 286.0 lb

## 2022-08-18 DIAGNOSIS — J4520 Mild intermittent asthma, uncomplicated: Secondary | ICD-10-CM

## 2022-08-18 DIAGNOSIS — J301 Allergic rhinitis due to pollen: Secondary | ICD-10-CM | POA: Diagnosis not present

## 2022-08-18 DIAGNOSIS — R69 Illness, unspecified: Secondary | ICD-10-CM | POA: Diagnosis not present

## 2022-08-18 DIAGNOSIS — J4521 Mild intermittent asthma with (acute) exacerbation: Secondary | ICD-10-CM | POA: Diagnosis not present

## 2022-08-18 DIAGNOSIS — F329 Major depressive disorder, single episode, unspecified: Secondary | ICD-10-CM

## 2022-08-18 MED ORDER — ALBUTEROL SULFATE HFA 108 (90 BASE) MCG/ACT IN AERS: INHALATION_SPRAY | RESPIRATORY_TRACT | 5 refills | Status: DC

## 2022-08-18 MED ORDER — MONTELUKAST SODIUM 10 MG PO TABS: ORAL_TABLET | ORAL | 1 refills | Status: DC

## 2022-08-18 MED ORDER — SERTRALINE HCL 100 MG PO TABS: 100.0000 mg | ORAL_TABLET | Freq: Every day | ORAL | 1 refills | Status: DC

## 2022-08-18 MED ORDER — IPRATROPIUM BROMIDE 0.02 % IN SOLN: RESPIRATORY_TRACT | 1 refills | Status: AC

## 2022-08-18 MED ORDER — FLUTICASONE-SALMETEROL 250-50 MCG/ACT IN AEPB: INHALATION_SPRAY | RESPIRATORY_TRACT | 1 refills | Status: DC
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
## 2022-08-18 NOTE — Telephone Encounter (Signed)
Rxs were sent to pharmacy today by provider. E-confirmation received for both rxs.   Requested Prescriptions  Refused Prescriptions Disp Refills   montelukast (SINGULAIR) 10 MG tablet [Pharmacy Med Name: MONTELUKAST SOD 10 MG TABLET] 90 tablet 1    Sig: TAKE 1 TABLET BY MOUTH EVERY DAY     Pulmonology:  Leukotriene Inhibitors Passed - 08/18/2022  2:51 AM      Passed - Valid encounter within last 12 months    Recent Outpatient Visits           Today Taking medication for chronic disease   [facility] [place], [place], MD   6 months ago Mild intermittent asthma without complication   [facility] [place], Deanna C, MD   1 year ago Abscessed tooth   [facility] [place], [place], MD   1 year ago Acute maxillary sinusitis, recurrence not specified   [facility] [place], [place], MD   1 year ago Aspiration syndrome, subsequent encounter   [facility] [place], [place], MD       Future Appointments             In 6 months Juline Patch, MD [place] at [facility], [place] 250-50 MCG/ACT [place] [Pharmacy Med Name: ADVAIR 250-50 [place] 180 each 1    Sig: INHALE 1 PUFF INTO THE LUNGS TWICE A DAY     Pulmonology:  Combination Products Passed - 08/18/2022  2:51 AM      Passed - Valid encounter within last 12 months    Recent Outpatient Visits           Today Taking medication for chronic disease   [facility] [place], Deanna C, MD   6 months ago Mild intermittent asthma without complication   [facility] [place], Deanna C, MD   1 year ago Abscessed tooth   [facility] [place], Deanna C, MD   1 year ago Acute maxillary sinusitis, recurrence not specified   [facility] [place], Deanna C, MD   1 year ago Aspiration syndrome, subsequent encounter   [facility] [place], [place], MD       Future Appointments             In 6 months Juline Patch, MD [place] at [facility], [facility]

## 2022-08-19 LAB — COMPREHENSIVE METABOLIC PANEL WITH GFR
ALT: 47 IU/L — ABNORMAL HIGH (ref 0–32)
AST: 43 IU/L — ABNORMAL HIGH (ref 0–40)
Albumin/Globulin Ratio: 2.1 (ref 1.2–2.2)
Albumin: 4.5 g/dL (ref 3.9–4.9)
Alkaline Phosphatase: 145 IU/L — ABNORMAL HIGH (ref 44–121)
BUN/Creatinine Ratio: 16 (ref 12–28)
BUN: 11 mg/dL (ref 8–27)
Bilirubin Total: 0.6 mg/dL (ref 0.0–1.2)
CO2: 21 mmol/L (ref 20–29)
Calcium: 9.5 mg/dL (ref 8.7–10.3)
Chloride: 105 mmol/L (ref 96–106)
Creatinine, Ser: 0.69 mg/dL (ref 0.57–1.00)
Globulin, Total: 2.1 g/dL (ref 1.5–4.5)
Glucose: 96 mg/dL (ref 70–99)
Potassium: 4.6 mmol/L (ref 3.5–5.2)
Sodium: 141 mmol/L (ref 134–144)
Total Protein: 6.6 g/dL (ref 6.0–8.5)
eGFR: 98 mL/min/1.73

## 2023-02-15 ENCOUNTER — Other Ambulatory Visit: Payer: Self-pay | Admitting: Family Medicine

## 2023-02-15 DIAGNOSIS — J4520 Mild intermittent asthma, uncomplicated: Secondary | ICD-10-CM

## 2023-02-15 DIAGNOSIS — J301 Allergic rhinitis due to pollen: Secondary | ICD-10-CM

## 2023-02-17 ENCOUNTER — Ambulatory Visit: Payer: BC Managed Care – PPO | Admitting: Family Medicine

## 2023-02-17 ENCOUNTER — Encounter: Payer: Self-pay | Admitting: Family Medicine

## 2023-02-17 VITALS — BP 138/82 | HR 60 | Ht 69.0 in | Wt 292.0 lb

## 2023-02-17 DIAGNOSIS — J301 Allergic rhinitis due to pollen: Secondary | ICD-10-CM

## 2023-02-17 DIAGNOSIS — Z111 Encounter for screening for respiratory tuberculosis: Secondary | ICD-10-CM

## 2023-02-17 DIAGNOSIS — J4520 Mild intermittent asthma, uncomplicated: Secondary | ICD-10-CM | POA: Diagnosis not present

## 2023-02-17 DIAGNOSIS — F329 Major depressive disorder, single episode, unspecified: Secondary | ICD-10-CM | POA: Diagnosis not present

## 2023-02-17 DIAGNOSIS — E785 Hyperlipidemia, unspecified: Secondary | ICD-10-CM

## 2023-02-17 MED ORDER — FLUTICASONE-SALMETEROL 250-50 MCG/ACT IN AEPB: 1.0000 | INHALATION_SPRAY | Freq: Two times a day (BID) | RESPIRATORY_TRACT | 1 refills | Status: DC

## 2023-02-17 MED ORDER — ALBUTEROL SULFATE HFA 108 (90 BASE) MCG/ACT IN AERS: INHALATION_SPRAY | RESPIRATORY_TRACT | 5 refills | Status: DC

## 2023-02-17 MED ORDER — MONTELUKAST SODIUM 10 MG PO TABS: ORAL_TABLET | ORAL | 1 refills | Status: DC

## 2023-02-17 MED ORDER — SERTRALINE HCL 100 MG PO TABS: 100.0000 mg | ORAL_TABLET | Freq: Every day | ORAL | 1 refills | Status: DC
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
## 2023-02-17 NOTE — Patient Instructions (Signed)

## 2023-02-20 LAB — QUANTIFERON-TB GOLD PLUS
QuantiFERON Mitogen Value: >10 [IU]/mL
QuantiFERON Nil Value: 0 [IU]/mL
QuantiFERON TB1 Ag Value: 0 [IU]/mL
QuantiFERON TB2 Ag Value: 0 [IU]/mL
QuantiFERON-TB Gold Plus: NEGATIVE

## 2023-02-20 LAB — LIPID PANEL WITH LDL/HDL RATIO
Cholesterol, Total: 141 mg/dL (ref 100–199)
HDL: 47 mg/dL (ref >39)
LDL Chol Calc (NIH): 81 mg/dL (ref 0–99)
LDL/HDL Ratio: 1.7 ratio (ref 0.0–3.2)
Triglycerides: 66 mg/dL (ref 0–149)
VLDL Cholesterol Cal: 13 mg/dL (ref 5–40)

## 2023-02-20 LAB — HEPATIC FUNCTION PANEL
ALT: 53 IU/L — ABNORMAL HIGH (ref 0–32)
AST: 44 IU/L — ABNORMAL HIGH (ref 0–40)
Albumin: 4.2 g/dL (ref 3.9–4.9)
Alkaline Phosphatase: 121 IU/L (ref 44–121)
Bilirubin Total: 0.6 mg/dL (ref 0.0–1.2)
Bilirubin, Direct: 0.21 mg/dL (ref 0.00–0.40)
Total Protein: 6.3 g/dL (ref 6.0–8.5)

## 2023-03-15 ENCOUNTER — Encounter: Payer: Self-pay | Admitting: Internal Medicine

## 2023-03-16 ENCOUNTER — Other Ambulatory Visit: Payer: Self-pay

## 2023-03-16 ENCOUNTER — Ambulatory Visit: Payer: BC Managed Care – PPO | Admitting: Anesthesiology

## 2023-03-16 ENCOUNTER — Ambulatory Visit
Admission: RE | Admit: 2023-03-16 | Discharge: 2023-03-16 | Disposition: A | Discharge status: Home / Self Care | Payer: BC Managed Care – PPO | Attending: Internal Medicine | Admitting: Internal Medicine

## 2023-03-16 ENCOUNTER — Encounter: Payer: Self-pay | Admitting: Internal Medicine

## 2023-03-16 ENCOUNTER — Encounter
Admission: RE | Disposition: A | Discharge status: Home / Self Care | Payer: Self-pay | Source: Home / Self Care | Attending: Internal Medicine

## 2023-03-16 DIAGNOSIS — D123 Benign neoplasm of transverse colon: Secondary | ICD-10-CM | POA: Diagnosis not present

## 2023-03-16 DIAGNOSIS — Z1211 Encounter for screening for malignant neoplasm of colon: Secondary | ICD-10-CM | POA: Diagnosis present

## 2023-03-16 DIAGNOSIS — F32A Depression, unspecified: Secondary | ICD-10-CM | POA: Diagnosis not present

## 2023-03-16 DIAGNOSIS — J4489 Other specified chronic obstructive pulmonary disease: Secondary | ICD-10-CM | POA: Diagnosis not present

## 2023-03-16 DIAGNOSIS — Z87891 Personal history of nicotine dependence: Secondary | ICD-10-CM | POA: Diagnosis not present

## 2023-03-16 DIAGNOSIS — D122 Benign neoplasm of ascending colon: Secondary | ICD-10-CM | POA: Diagnosis not present

## 2023-03-16 DIAGNOSIS — D125 Benign neoplasm of sigmoid colon: Secondary | ICD-10-CM | POA: Diagnosis not present

## 2023-03-16 DIAGNOSIS — K640 First degree hemorrhoids: Secondary | ICD-10-CM | POA: Insufficient documentation

## 2023-03-16 HISTORY — PX: COLONOSCOPY WITH PROPOFOL: SHX5780

## 2023-03-16 HISTORY — PX: POLYPECTOMY: SHX5525

## 2023-03-16 HISTORY — PX: HEMOSTASIS CLIP PLACEMENT: SHX6857

## 2023-03-16 SURGERY — COLONOSCOPY WITH PROPOFOL
Anesthesia: General

## 2023-03-16 MED ORDER — PROPOFOL 10 MG/ML IV BOLUS
INTRAVENOUS | Status: AC
  Filled 2023-03-16: qty 20

## 2023-03-16 MED ORDER — MIDAZOLAM HCL 2 MG/2ML IJ SOLN
INTRAMUSCULAR | Status: DC | PRN
  Administered 2023-03-16: 2 mg via INTRAVENOUS

## 2023-03-16 MED ORDER — PROPOFOL 500 MG/50ML IV EMUL
INTRAVENOUS | Status: DC | PRN
  Administered 2023-03-16: 100 ug/kg/min via INTRAVENOUS

## 2023-03-16 MED ORDER — FENTANYL CITRATE (PF) 100 MCG/2ML IJ SOLN
INTRAMUSCULAR | Status: AC
  Filled 2023-03-16: qty 2

## 2023-03-16 MED ORDER — PROPOFOL 10 MG/ML IV BOLUS
INTRAVENOUS | Status: DC | PRN
  Administered 2023-03-16: 70 mg via INTRAVENOUS

## 2023-03-16 MED ORDER — GLYCOPYRROLATE 0.2 MG/ML IJ SOLN
INTRAMUSCULAR | Status: DC | PRN
  Administered 2023-03-16: .2 mg via INTRAVENOUS

## 2023-03-16 MED ORDER — PROPOFOL 1000 MG/100ML IV EMUL
INTRAVENOUS | Status: AC
  Filled 2023-03-16: qty 100

## 2023-03-16 MED ORDER — SODIUM CHLORIDE 0.9 % IV SOLN: INTRAVENOUS | Status: DC

## 2023-03-16 MED ORDER — FENTANYL CITRATE (PF) 100 MCG/2ML IJ SOLN
INTRAMUSCULAR | Status: DC | PRN
  Administered 2023-03-16 (×2): 50 ug via INTRAVENOUS

## 2023-03-16 MED ORDER — ONDANSETRON HCL 4 MG/2ML IJ SOLN
INTRAMUSCULAR | Status: DC | PRN
  Administered 2023-03-16: 4 mg via INTRAVENOUS

## 2023-03-16 MED ORDER — MIDAZOLAM HCL 2 MG/2ML IJ SOLN
INTRAMUSCULAR | Status: AC
  Filled 2023-03-16: qty 2

## 2023-03-16 NOTE — Interval H&P Note (Signed)
History and Physical Interval Note:  03/16/2023 12:33 PM  Annette Perez  has presented today for surgery, with the diagnosis of Z86.010 (ICD-10-CM) - History of colon polyps.  The various methods of treatment have been discussed with the patient and family. After consideration of risks, benefits and other options for treatment, the patient has consented to  Procedure(s): COLONOSCOPY WITH PROPOFOL (N/A) as a surgical intervention.  The patient's history has been reviewed, patient examined, no change in status, stable for surgery.  I have reviewed the patient's chart and labs.  Questions were answered to the patient's satisfaction.     [place], [place]

## 2023-03-16 NOTE — H&P (Signed)
Outpatient short stay form Pre-procedure 03/16/2023 12:33 PM Lanny Donoso K. Norma Fredrickson, M.D.  Primary Physician: Elizabeth Sauer, M.D.  Reason for visit:  Personal history of tubular adenomas of the colon.  History of present illness:  12 29/2020 Physician: Dr. Leavy Cella @ [facility] Polyps Removed: Adenomatous Polyps i.e. 16mm polyp THREE YEAR REPEAT RECOMMENDED.  Patient denies change in bowel habits, rectal bleeding, weight loss or abdominal pain.     Current Facility-Administered Medications:    0.9 %  sodium chloride infusion, , Intravenous, Continuous, Brentley [place], Boykin Nearing, MD  Facility-Administered Medications Prior to Admission  Medication Dose Route Frequency Provider Last Rate Last Admin   ipratropium-albuterol (DUONEB) 0.5-2.5 (3) MG/3ML nebulizer solution 3 mL  3 mL Nebulization Once Duanne Limerick, MD       Medications Prior to Admission  Medication Sig Dispense Refill Last Dose   albuterol (VENTOLIN HFA) 108 (90 Base) MCG/ACT inhaler Inhale 1-2 puffs into the lungs every 6 hours as needed for shortness of breath 6.7 each 5 03/15/2023   b complex vitamins tablet Take 1 tablet by mouth every other day.   Past Week   Calcium Carbonate-Vit D-Min (CALCIUM 1200 PO) Take 1 tablet by mouth daily.   Past Week   Cholecalciferol (VITAMIN D3) 250 MCG (10000 UT) capsule Take 1,000 Units by mouth daily.    Past Week   fluticasone (FLONASE) 50 MCG/ACT nasal spray Place into both nostrils as needed for allergies or rhinitis.   03/15/2023   fluticasone-salmeterol (WIXELA INHUB) 250-50 MCG/ACT AEPB Inhale 1 puff into the lungs in the morning and at bedtime. 60 each 1 03/15/2023   ipratropium (ATROVENT) 0.02 % nebulizer solution INHALE 1 VIAL INTO THE LUNGS VIA NEBULIZER 4 TIMES A DAY 62.5 mL 1 03/15/2023   loratadine (CLARITIN) 10 MG tablet Take 10 mg by mouth daily as needed for allergies.   03/15/2023   montelukast (SINGULAIR) 10 MG tablet TAKE 1 TABLET BY MOUTH EVERY DAY 90 tablet 1 03/15/2023   Multiple  Vitamin (MULTIVITAMIN) tablet Take 1 tablet by mouth daily.   Past Week   nystatin (MYCOSTATIN/NYSTOP) powder Apply 1 application. topically 3 (three) times daily. Apply to affected area for up to 7 days 15 g 3 Past Week   sertraline (ZOLOFT) 100 MG tablet Take 1 tablet (100 mg total) by mouth daily. 90 tablet 1 03/15/2023   zinc gluconate 50 MG tablet Take 50 mg by mouth 2 (two) times daily.   03/15/2023     Allergies  Allergen Reactions   Ibuprofen Other (See Comments)    Weight loss surgery     Past Medical History:  Diagnosis Date   Asthma    Depression    GERD (gastroesophageal reflux disease)    has hiatal hernia surgery    History of hiatal hernia    repaired   Mood swings    Pneumonia    "walking pneumonia when young"    Review of systems:  Otherwise negative.    Physical Exam  Gen: Alert, oriented. Appears stated age.  HEENT: [place]/AT. PERRLA. Lungs: CTA, no wheezes. CV: RR nl S1, S2. Abd: soft, benign, no masses. BS+ Ext: No edema. Pulses 2+    Planned procedures: Proceed with colonoscopy. The patient understands the nature of the planned procedure, indications, risks, alternatives and potential complications including but not limited to bleeding, infection, perforation, damage to internal organs and possible oversedation/side effects from anesthesia. The patient agrees and gives consent to proceed.  Please refer to procedure notes for  findings, recommendations and patient disposition/instructions.     Franck Vinal K. Norma Fredrickson, M.D. Gastroenterology 03/16/2023  12:33 PM

## 2023-03-16 NOTE — Anesthesia Postprocedure Evaluation (Signed)
Anesthesia Post Note  Patient: Annette Perez  Procedure(s) Performed: COLONOSCOPY WITH PROPOFOL POLYPECTOMY HEMOSTASIS CLIP PLACEMENT HOT HEMOSTASIS (ARGON PLASMA COAGULATION/BICAP)  Patient location during evaluation: Endoscopy Anesthesia Type: General Level of consciousness: awake and alert Pain management: pain level controlled Vital Signs Assessment: post-procedure vital signs reviewed and stable Respiratory status: spontaneous breathing, nonlabored ventilation, respiratory function stable and patient connected to nasal cannula oxygen Cardiovascular status: blood pressure returned to baseline and stable Postop Assessment: no apparent nausea or vomiting Anesthetic complications: no   No notable events documented.   Last Vitals:  Vitals:   03/16/23 1400 03/16/23 1410  BP: (!) 95/50 (!) 111/54  Pulse: 64 63  Resp: 15 16  Temp: (!) 36.1 C   SpO2: 96% 98%    Last Pain:  Vitals:   03/16/23 1410  TempSrc:   PainSc: 0-No pain                 Louie Boston

## 2023-03-16 NOTE — Op Note (Signed)
[facility] Gastroenterology Patient Name: Annette Perez Procedure Date: 03/16/2023 1:16 PM MRN: [id number] Account #: [id number] Date of Birth: 01/04/1961 Admit Type: Outpatient Age: 62 Room: [facility] ENDO ROOM 2 Gender: Female Note Status: Finalized Instrument Name: Prentice Docker [id number] Procedure:             Colonoscopy Indications:           High risk colon cancer surveillance: Personal history                         of non-advanced adenoma Providers:             Boykin Nearing. Tammra Pressman MD, MD Medicines:             Propofol per Anesthesia Complications:         No immediate complications. Estimated blood loss: None. Procedure:             Pre-Anesthesia Assessment:                        - The risks and benefits of the procedure and the                         sedation options and risks were discussed with the                         patient. All questions were answered and informed                         consent was obtained.                        - Patient identification and proposed procedure were                         verified prior to the procedure by the nurse. The                         procedure was verified in the procedure room.                        - ASA Grade Assessment: III - A patient with severe                         systemic disease.                        - After reviewing the risks and benefits, the patient                         was deemed in satisfactory condition to undergo the                         procedure.                        After obtaining informed consent, the colonoscope was                         passed under direct vision. Throughout the procedure,  the patient's blood pressure, pulse, and oxygen                         saturations were monitored continuously. The                         Colonoscope was introduced through the anus and                         advanced to the the cecum, identified by  appendiceal                         orifice and ileocecal valve. The colonoscopy was                         performed without difficulty. The patient tolerated                         the procedure well. The quality of the bowel                         preparation was adequate. The ileocecal valve,                         appendiceal orifice, and rectum were photographed. Findings:      The perianal and digital rectal examinations were normal. Pertinent       negatives include normal sphincter tone and no palpable rectal lesions.      Non-bleeding internal hemorrhoids were found during retroflexion. The       hemorrhoids were Grade I (internal hemorrhoids that do not prolapse).      Two sessile polyps were found in the sigmoid colon and transverse colon.       The polyps were 11 to 14 mm in size. These polyps were removed with a       hot snare. Resection and retrieval were complete.      A 9 mm polyp was found in the ascending colon. The polyp was sessile.       The polyp was removed with a hot snare. Resection and retrieval were       complete. To prevent bleeding after the polypectomy, one hemostatic clip       was successfully placed (MR conditional). Clip manufacturer: [employer]. There was no bleeding during, or at the end, of the       procedure.      The exam was otherwise without abnormality. Impression:            - Non-bleeding internal hemorrhoids.                        - Two 11 to 14 mm polyps in the sigmoid colon and in                         the transverse colon, removed with a hot snare.                         Resected and retrieved.                        -  One 9 mm polyp in the ascending colon, removed with                         a hot snare. Resected and retrieved. Clip (MR                         conditional) was placed. Clip manufacturer: [employer].                        - The examination was otherwise  normal. Recommendation:        - Patient has a contact number available for                         emergencies. The signs and symptoms of potential                         delayed complications were discussed with the patient.                         Return to normal activities tomorrow. Written                         discharge instructions were provided to the patient.                        - Resume previous diet.                        - Continue present medications.                        - Repeat colonoscopy in 3 - 5 years for surveillance                         based on pathology results.                        - Return to GI office PRN.                        - The findings and recommendations were discussed with                         the patient. Procedure Code(s):     --- Professional ---                        [phone], Colonoscopy, flexible; with removal of                         tumor(s), polyp(s), or other lesion(s) by snare                         technique Diagnosis Code(s):     --- Professional ---                        K64.0, First degree hemorrhoids  D12.2, Benign neoplasm of ascending colon                        D12.3, Benign neoplasm of transverse colon (hepatic                         flexure or splenic flexure)                        D12.5, Benign neoplasm of sigmoid colon                        Z86.010, Personal history of colonic polyps CPT copyright 2022 American Medical Association. All rights reserved. The codes documented in this report are preliminary and upon coder review may  be revised to meet current compliance requirements. Stanton Kidney MD, MD 03/16/2023 1:50:09 PM This report has been signed electronically. Number of Addenda: 0 Note Initiated On: 03/16/2023 1:16 PM Scope Withdrawal Time: 0 hours 7 minutes 6 seconds  Total Procedure Duration: 0 hours 13 minutes 23 seconds  Estimated Blood Loss:  Estimated blood loss:  none.      [facility]

## 2023-03-16 NOTE — Transfer of Care (Signed)
Immediate Anesthesia Transfer of Care Note  Patient: Annette Perez  Procedure(s) Performed: COLONOSCOPY WITH PROPOFOL POLYPECTOMY HEMOSTASIS CLIP PLACEMENT HOT HEMOSTASIS (ARGON PLASMA COAGULATION/BICAP)  Patient Location: PACU  Anesthesia Type:General  Level of Consciousness: awake, alert , and oriented  Airway & Oxygen Therapy: Patient Spontanous Breathing and Patient connected to nasal cannula oxygen  Post-op Assessment: Report given to RN and Post -op Vital signs reviewed and stable  Post vital signs: Reviewed and stable  Last Vitals:  Vitals Value Taken Time  BP    Temp    Pulse    Resp    SpO2      Last Pain:  Vitals:   03/16/23 1226  TempSrc: Temporal  PainSc: 0-No pain         Complications: No notable events documented.
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
## 2023-03-17 ENCOUNTER — Encounter: Payer: Self-pay | Admitting: Internal Medicine

## 2023-03-21 LAB — SURGICAL PATHOLOGY

## 2023-05-24 ENCOUNTER — Ambulatory Visit: Payer: BC Managed Care – PPO | Admitting: Family Medicine

## 2023-05-24 ENCOUNTER — Encounter: Payer: Self-pay | Admitting: Family Medicine

## 2023-05-24 VITALS — BP 122/82 | HR 62 | Resp 16 | Ht 69.0 in | Wt 293.0 lb

## 2023-05-24 DIAGNOSIS — L03114 Cellulitis of left upper limb: Secondary | ICD-10-CM

## 2023-05-24 DIAGNOSIS — J301 Allergic rhinitis due to pollen: Secondary | ICD-10-CM

## 2023-05-24 DIAGNOSIS — J4520 Mild intermittent asthma, uncomplicated: Secondary | ICD-10-CM | POA: Diagnosis not present

## 2023-05-24 DIAGNOSIS — F329 Major depressive disorder, single episode, unspecified: Secondary | ICD-10-CM | POA: Diagnosis not present

## 2023-05-24 MED ORDER — FLUTICASONE-SALMETEROL 250-50 MCG/ACT IN AEPB
1.0000 | INHALATION_SPRAY | Freq: Two times a day (BID) | RESPIRATORY_TRACT | 1 refills | Status: DC
Start: 2023-05-24 — End: 2023-09-01

## 2023-05-24 MED ORDER — DOXYCYCLINE HYCLATE 100 MG PO TABS
100.0000 mg | ORAL_TABLET | Freq: Two times a day (BID) | ORAL | 0 refills | Status: AC
Start: 2023-05-24 — End: ?

## 2023-05-24 MED ORDER — TRIAMCINOLONE ACETONIDE 0.1 % EX CREA
1.0000 | TOPICAL_CREAM | Freq: Two times a day (BID) | CUTANEOUS | 0 refills | Status: AC
Start: 2023-05-24 — End: ?

## 2023-05-24 MED ORDER — MUPIROCIN 2 % EX OINT
1.0000 | TOPICAL_OINTMENT | Freq: Two times a day (BID) | CUTANEOUS | 0 refills | Status: AC
Start: 2023-05-24 — End: ?

## 2023-05-24 MED ORDER — ALBUTEROL SULFATE HFA 108 (90 BASE) MCG/ACT IN AERS: INHALATION_SPRAY | RESPIRATORY_TRACT | 5 refills | Status: AC

## 2023-05-24 MED ORDER — SERTRALINE HCL 100 MG PO TABS: 100.0000 mg | ORAL_TABLET | Freq: Every day | ORAL | 1 refills | Status: AC

## 2023-05-24 MED ORDER — MONTELUKAST SODIUM 10 MG PO TABS
ORAL_TABLET | ORAL | 1 refills | Status: DC
Start: 2023-05-24 — End: 2023-12-23
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
## 2023-09-01 ENCOUNTER — Other Ambulatory Visit: Payer: Self-pay | Admitting: Family Medicine

## 2023-09-01 DIAGNOSIS — J4520 Mild intermittent asthma, uncomplicated: Secondary | ICD-10-CM

## 2023-11-02 ENCOUNTER — Other Ambulatory Visit: Payer: Self-pay | Admitting: Family Medicine

## 2023-11-02 DIAGNOSIS — J4520 Mild intermittent asthma, uncomplicated: Secondary | ICD-10-CM

## 2023-12-23 ENCOUNTER — Other Ambulatory Visit: Payer: Self-pay

## 2023-12-23 DIAGNOSIS — J301 Allergic rhinitis due to pollen: Secondary | ICD-10-CM

## 2023-12-23 DIAGNOSIS — J4520 Mild intermittent asthma, uncomplicated: Secondary | ICD-10-CM

## 2023-12-23 MED ORDER — MONTELUKAST SODIUM 10 MG PO TABS: ORAL_TABLET | ORAL | 0 refills | Status: AC

## 2024-01-04 ENCOUNTER — Other Ambulatory Visit: Payer: Self-pay | Admitting: Family Medicine

## 2024-01-04 DIAGNOSIS — Z1231 Encounter for screening mammogram for malignant neoplasm of breast: Secondary | ICD-10-CM
